# Patient Record
Sex: Male | Born: 1945 | Race: White | Hispanic: No | Marital: Married | Smoking: Never smoker
Health system: Southern US, Community
[De-identification: ages and names within clinical notes are randomized; demographics above are authoritative.]

## PROBLEM LIST (undated history)

## (undated) DIAGNOSIS — I10 Essential (primary) hypertension: Secondary | ICD-10-CM

## (undated) DIAGNOSIS — E079 Disorder of thyroid, unspecified: Secondary | ICD-10-CM

## (undated) DIAGNOSIS — Z860100 Personal history of colon polyps, unspecified: Secondary | ICD-10-CM

## (undated) DIAGNOSIS — D126 Benign neoplasm of colon, unspecified: Secondary | ICD-10-CM

## (undated) DIAGNOSIS — T7840XA Allergy, unspecified, initial encounter: Secondary | ICD-10-CM

## (undated) DIAGNOSIS — E7800 Pure hypercholesterolemia, unspecified: Secondary | ICD-10-CM

## (undated) HISTORY — DX: Personal history of colon polyps, unspecified: Z86.0100

## (undated) HISTORY — PX: FINGER SURGERY: SHX640

## (undated) HISTORY — DX: Disorder of thyroid, unspecified: E07.9

## (undated) HISTORY — DX: Essential (primary) hypertension: I10

## (undated) HISTORY — DX: Benign neoplasm of colon, unspecified: D12.6

## (undated) HISTORY — DX: Allergy, unspecified, initial encounter: T78.40XA

## (undated) HISTORY — DX: Pure hypercholesterolemia, unspecified: E78.00

## (undated) HISTORY — DX: Personal history of colonic polyps: Z86.010

## (undated) HISTORY — PX: ELBOW SURGERY: SHX618

---

## 2000-12-12 ENCOUNTER — Encounter (INDEPENDENT_AMBULATORY_CARE_PROVIDER_SITE_OTHER): Payer: Self-pay | Admitting: *Deleted

## 2000-12-12 ENCOUNTER — Ambulatory Visit (HOSPITAL_COMMUNITY): Admission: RE | Admit: 2000-12-12 | Discharge: 2000-12-12 | Payer: Self-pay | Admitting: Gastroenterology

## 2006-02-03 ENCOUNTER — Ambulatory Visit: Payer: Self-pay | Admitting: Family Medicine

## 2006-02-07 ENCOUNTER — Ambulatory Visit: Payer: Self-pay | Admitting: Family Medicine

## 2006-09-29 ENCOUNTER — Ambulatory Visit: Payer: Self-pay | Admitting: Family Medicine

## 2006-12-09 ENCOUNTER — Ambulatory Visit: Payer: Self-pay | Admitting: Family Medicine

## 2006-12-16 ENCOUNTER — Ambulatory Visit: Payer: Self-pay | Admitting: Family Medicine

## 2007-05-08 ENCOUNTER — Ambulatory Visit: Payer: Self-pay | Admitting: Family Medicine

## 2007-09-08 ENCOUNTER — Ambulatory Visit: Payer: Self-pay | Admitting: Family Medicine

## 2008-05-30 ENCOUNTER — Ambulatory Visit: Payer: Self-pay | Admitting: Family Medicine

## 2008-06-10 ENCOUNTER — Ambulatory Visit: Payer: Self-pay | Admitting: Family Medicine

## 2008-06-14 ENCOUNTER — Ambulatory Visit: Payer: Self-pay | Admitting: Family Medicine

## 2008-08-16 ENCOUNTER — Ambulatory Visit: Payer: Self-pay | Admitting: Family Medicine

## 2009-08-03 ENCOUNTER — Ambulatory Visit: Payer: Self-pay | Admitting: Family Medicine

## 2009-09-04 ENCOUNTER — Ambulatory Visit: Payer: Self-pay | Admitting: Family Medicine

## 2010-03-05 ENCOUNTER — Other Ambulatory Visit: Payer: Self-pay | Admitting: Gastroenterology

## 2010-03-05 LAB — HM COLONOSCOPY: HM Colonoscopy: NORMAL

## 2010-06-08 NOTE — Op Note (Signed)
[place]  Patient:    Neil, Rivera Visit Number: [id number] MRN: [id number]          Service Type: END Location: ENDO Attending Physician:  Rich Brave Dictated by:   Florencia Reasons, M.D. Proc. Date: 12/12/00 Admit Date:  12/12/2000 Discharge Date: 12/12/2000   CC:         Ronnald Nian, M.D.   Operative Report  PROCEDURE:  Upper endoscopy.  ENDOSCOPIST:  Florencia Reasons, M.D.  INDICATIONS:  Transiently-positive Hemoccult stool in a 65 year old.  FINDINGS:  Small hiatal hernia with esophageal ring.  Possible chronic esophageal changes from acid reflux.  INFORMED CONSENT:  The nature, purpose and risk of the procedure have been discussed with the patient who provided written consent.  DESCRIPTION OF PROCEDURE:  Sedation was fentanyl 60 mcg and Versed 6 mg IV without arrhythmias or desaturation.  The Fujinon video endoscope was passed under direct vision.  The esophagus was basically normal.  The distal esophagus did have some slight squamous irregularity raising the question of some chronic esophageal inflammatory changes in the past, but there was no evidence of active inflammation at this time nor any evidence of Barretts esophagus, varices, infection or neoplasia.  A widely patent esophageal ring was present and below this was a small 2 cm hiatal hernia.  The stomach contained no significant residual and had normal mucosa without evidence of gastritis, erosions, ulcers, polyps or masses including a retroflexed view of the proximal stomach. The pylorus, duodenal bulb and second duodenum looked normal.  The scope was removed from the patient.  No biopsies were obtained.  He tolerated the procedure well, and there were no apparent complications.  IMPRESSION:   Small hiatal hernia with esophageal ring.  Otherwise normal exam.  PLAN:  Proceed to colonoscopic evaluation since no source of heme-positive stool was  identified on the current examination. Dictated by:   Florencia Reasons, M.D. Attending Physician:  Rich Brave DD:  12/12/00 TD:  12/14/00 Job: [id number]

## 2010-06-08 NOTE — Procedures (Signed)
. [facility]  Patient:    Neil Rivera, Neil Rivera Visit Number: [id number] MRN: [id number]          Service Type: END Location: ENDO Attending Physician:  Rich Brave Dictated by:   Florencia Reasons, M.D. Proc. Date: 12/12/00 Admit Date:  12/12/2000 Discharge Date: 12/12/2000   CC:         Ronnald Nian, M.D.   Procedure Report  PROCEDURE:  Colonoscopy with biopsies.  ENDOSCOPIST:  Florencia Reasons, M.D.  INDICATION:  Heme-positive stool in a 65 year old with a negative upper endoscopy.  FINDINGS:  Three small colon polyps.  Otherwise normal exam.  No source of heme positivity identified.  DESCRIPTION OF PROCEDURE:  The nature, purposes, and risks of the procedure had been discussed with the patient who provided written consent.  Sedation for this procedure and the upper endoscopy which preceded it was fentanyl 100 mcg and Versed 10 mg IV without arrhythmias or desaturation.  Digital exam of the prostate was unremarkable.  The Olympus colonoscope was advanced to the cecum and pullback was the performed.  There was a 3-mm sessile polyp biopsied in the cecum, and there were two small sessile pale polyps in the ascending colon, again essentially removed by cold biopsy technique.  No other polyps were seen.  There was no evidence of large polyps, cancer, colitis, vascular malformations, or diverticular disease anywhere in the colon.  The quality of the prep was very good, and it was felt that all areas were well seen.  The patient tolerated the procedure well, and there were no apparent complications.  IMPRESSION:  Several small colon polyps essentially removed by cold biopsy technique.  Otherwise normal exam.  No source of transient heme positivity identified.  PLAN:  Await pathology on polyps. Dictated by:   Florencia Reasons, M.D. Attending Physician:  Rich Brave DD:  12/12/00 TD:  12/14/00 Job:  [id number]

## 2010-08-31 ENCOUNTER — Other Ambulatory Visit: Payer: Self-pay | Admitting: Family Medicine

## 2010-10-03 ENCOUNTER — Encounter: Payer: Self-pay | Admitting: Family Medicine

## 2010-10-05 ENCOUNTER — Encounter: Payer: Self-pay | Admitting: Family Medicine

## 2010-10-05 ENCOUNTER — Ambulatory Visit (INDEPENDENT_AMBULATORY_CARE_PROVIDER_SITE_OTHER): Payer: BC Managed Care – PPO | Admitting: Family Medicine

## 2010-10-05 VITALS — BP 140/82 | HR 64 | Ht 69.0 in | Wt 175.0 lb

## 2010-10-05 DIAGNOSIS — Z0000 Encounter for general adult medical examination without abnormal findings: Secondary | ICD-10-CM

## 2010-10-05 DIAGNOSIS — E039 Hypothyroidism, unspecified: Secondary | ICD-10-CM | POA: Insufficient documentation

## 2010-10-05 DIAGNOSIS — E785 Hyperlipidemia, unspecified: Secondary | ICD-10-CM

## 2010-10-05 DIAGNOSIS — Z833 Family history of diabetes mellitus: Secondary | ICD-10-CM | POA: Insufficient documentation

## 2010-10-05 DIAGNOSIS — Z23 Encounter for immunization: Secondary | ICD-10-CM

## 2010-10-05 DIAGNOSIS — Z8249 Family history of ischemic heart disease and other diseases of the circulatory system: Secondary | ICD-10-CM

## 2010-10-05 DIAGNOSIS — I10 Essential (primary) hypertension: Secondary | ICD-10-CM

## 2010-10-05 LAB — COMPREHENSIVE METABOLIC PANEL
Albumin: 4.4 g/dL (ref 3.5–5.2)
Alkaline Phosphatase: 81 U/L (ref 39–117)
CO2: 27 mEq/L (ref 19–32)
Calcium: 9.3 mg/dL (ref 8.4–10.5)
Chloride: 106 mEq/L (ref 96–112)
Glucose, Bld: 102 mg/dL — ABNORMAL HIGH (ref 70–99)
Potassium: 3.9 mEq/L (ref 3.5–5.3)
Sodium: 142 mEq/L (ref 135–145)
Total Protein: 6.8 g/dL (ref 6.0–8.3)

## 2010-10-05 LAB — CBC WITH DIFFERENTIAL/PLATELET
Basophils Relative: 1 % (ref 0–1)
Eosinophils Absolute: 0.3 10*3/uL (ref 0.0–0.7)
Lymphs Abs: 1.3 10*3/uL (ref 0.7–4.0)
MCH: 33.5 pg (ref 26.0–34.0)
MCHC: 33.9 g/dL (ref 30.0–36.0)
Neutrophils Relative %: 64 % (ref 43–77)
Platelets: 146 10*3/uL — ABNORMAL LOW (ref 150–400)
RBC: 4.96 MIL/uL (ref 4.22–5.81)

## 2010-10-05 LAB — LIPID PANEL
LDL Cholesterol: 85 mg/dL (ref 0–99)
Triglycerides: 87 mg/dL (ref <150)

## 2010-10-05 NOTE — Progress Notes (Signed)
Pt referred to Dr Melburn Popper [phone]  Oct 18 9:45a they have moved to [employer] [address] [employer]   Pt made aware

## 2010-10-05 NOTE — Progress Notes (Signed)
  Subjective:    Patient ID: Neil Rivera, male    DOB: 03-01-1945, 65 y.o.   MRN: [id number]  HPI He is here for complete examination. He does exercise fairly regularly with walking. Allergies are under good control. He continues on blood pressure medication as well as his Lipitor and thyroid medication. He has no concerns about any of these. His work continues to go well. He was remarried several years ago and this is going well. Work is also going well.  Review of Systems  Constitutional: Negative.   HENT: Negative.   Eyes: Negative.   Respiratory: Negative for choking.   Cardiovascular: Negative.   Gastrointestinal: Negative.   Genitourinary: Negative.   Neurological: Negative.        Objective:   Physical Exam BP 140/82  Pulse 64  Ht 5\' 9"  (1.753 m)  Wt 175 lb (79.379 kg)  BMI 25.84 kg/m2  General Appearance:    Alert, cooperative, no distress, appears stated age  Head:    Normocephalic, without obvious abnormality, atraumatic  Eyes:    PERRL, conjunctiva/corneas clear, EOM's intact, fundi    benign  Ears:    Normal TM's and external ear canals  Nose:   Nares normal, mucosa normal, no drainage or sinus   tenderness  Throat:   Lips, mucosa, and tongue normal; teeth and gums normal  Neck:   Supple, no lymphadenopathy;  thyroid:  no   enlargement/tenderness/nodules; no carotid   bruit or JVD  Back:    Spine nontender, no curvature, ROM normal, no CVA     tenderness  Lungs:     Clear to auscultation bilaterally without wheezes, rales or     ronchi; respirations unlabored  Chest Wall:    No tenderness or deformity   Heart:    Regular rate and rhythm, S1 and S2 normal, no murmur, rub   or gallop  Breast Exam:    No chest wall tenderness, masses or gynecomastia  Abdomen:     Soft, non-tender, nondistended, normoactive bowel sounds,    no masses, no hepatosplenomegaly      Rectal:    Normal sphincter tone, no masses or tenderness; guaiac negative stool.  Prostate smooth, no  nodules, not enlarged.  Extremities:   No clubbing, cyanosis or edema  Pulses:   2+ and symmetric all extremities  Skin:   Skin color, texture, turgor normal, no rashes or lesions  Lymph nodes:   Cervical, supraclavicular, and axillary nodes normal  Neurologic:   CNII-XII intact, normal strength, sensation and gait; reflexes 2+ and symmetric throughout          Psych:   Normal mood, affect, hygiene and grooming.           Assessment & Plan:   1. Hyperlipidemia LDL goal < 100    2. Hypertension    3. Hypothyroid  TSH  4. Family history of heart disease in male family member before age 42  Ambulatory referral to Cardiology, PR ELECTROCARDIOGRAM, COMPLETE, CBC w/Diff, Comprehensive metabolic panel, Lipid panel  5. Family history of diabetes mellitus    6. Routine general medical examination at a health care facility  CBC w/Diff, Comprehensive metabolic panel, Lipid panel, Flu vaccine greater than or equal to 3yo with preservative IM

## 2010-10-08 ENCOUNTER — Telehealth: Payer: Self-pay

## 2010-10-08 NOTE — Telephone Encounter (Signed)
Called pt to inform pt that labs look good

## 2010-10-14 ENCOUNTER — Emergency Department (HOSPITAL_COMMUNITY): Payer: BC Managed Care – PPO

## 2010-10-14 ENCOUNTER — Emergency Department (HOSPITAL_COMMUNITY)
Admission: EM | Admit: 2010-10-14 | Discharge: 2010-10-14 | Disposition: A | Discharge status: Home / Self Care | Payer: BC Managed Care – PPO | Attending: Emergency Medicine | Admitting: Emergency Medicine

## 2010-10-14 DIAGNOSIS — I498 Other specified cardiac arrhythmias: Secondary | ICD-10-CM | POA: Insufficient documentation

## 2010-10-14 DIAGNOSIS — I10 Essential (primary) hypertension: Secondary | ICD-10-CM | POA: Insufficient documentation

## 2010-10-14 DIAGNOSIS — H81399 Other peripheral vertigo, unspecified ear: Secondary | ICD-10-CM | POA: Insufficient documentation

## 2010-10-14 DIAGNOSIS — R1110 Vomiting, unspecified: Secondary | ICD-10-CM | POA: Insufficient documentation

## 2010-10-14 LAB — POCT I-STAT TROPONIN I

## 2010-10-15 ENCOUNTER — Ambulatory Visit (INDEPENDENT_AMBULATORY_CARE_PROVIDER_SITE_OTHER): Payer: BC Managed Care – PPO | Admitting: Family Medicine

## 2010-10-15 ENCOUNTER — Encounter: Payer: Self-pay | Admitting: Family Medicine

## 2010-10-15 VITALS — BP 110/70 | HR 56 | Wt 172.0 lb

## 2010-10-15 DIAGNOSIS — I498 Other specified cardiac arrhythmias: Secondary | ICD-10-CM

## 2010-10-15 DIAGNOSIS — R42 Dizziness and giddiness: Secondary | ICD-10-CM

## 2010-10-15 DIAGNOSIS — R001 Bradycardia, unspecified: Secondary | ICD-10-CM

## 2010-10-15 NOTE — Patient Instructions (Signed)
Stay on your present blood pressure dosing. Check your pulse daily and call me if it stays in the 50 range.

## 2010-10-15 NOTE — Progress Notes (Signed)
  Subjective:    Patient ID: Neil Rivera, male    DOB: 06-12-1945, 65 y.o.   MRN: [id number]  HPI He was seen yesterday in the emergency room and evaluated for dizziness. He said he woke up yesterday dizzy and eventually went to urgent care. Apparently his pulse was low and he sent him to the emergency room. He was evaluated there and sent home. He was told that this was more likely an inner ear infection but to cut back on his blood pressure medication. Today he is having no difficulty with dizziness, weakness, heart rate.   Review of Systems     Objective:   Physical Exam Alert and in no distress. Cardiac exam shows a regular sinus rhythm without murmurs or gallops. Lungs are clear to auscultation.      Assessment & Plan:   1. Vertigo   2. Bradycardia    he is to use the Antivert as needed and keep track of his pulse rate. If it drops down into the 50s, he will let me know. He does have an appointment to see his cardiologist. I also recommend that he not cut back on his blood pressure medicine and potentially discuss this with his cardiologist.

## 2010-11-07 ENCOUNTER — Encounter: Payer: Self-pay | Admitting: Cardiology

## 2010-11-08 ENCOUNTER — Encounter: Payer: Self-pay | Admitting: Cardiovascular Disease

## 2010-11-08 ENCOUNTER — Ambulatory Visit (INDEPENDENT_AMBULATORY_CARE_PROVIDER_SITE_OTHER): Payer: BC Managed Care – PPO | Admitting: Cardiovascular Disease

## 2010-11-08 DIAGNOSIS — I498 Other specified cardiac arrhythmias: Secondary | ICD-10-CM

## 2010-11-08 DIAGNOSIS — R42 Dizziness and giddiness: Secondary | ICD-10-CM

## 2010-11-08 DIAGNOSIS — R001 Bradycardia, unspecified: Secondary | ICD-10-CM

## 2010-11-08 DIAGNOSIS — I10 Essential (primary) hypertension: Secondary | ICD-10-CM

## 2010-11-08 MED ORDER — HYDROCHLOROTHIAZIDE 25 MG PO TABS: 12.5000 mg | ORAL_TABLET | Freq: Every day | ORAL | Status: DC

## 2010-11-08 NOTE — Progress Notes (Signed)
Neil Rivera Date of Birth  18-Jul-1945 [facility] [address]    [address] [place], [place]  [address] [phone]  Fax  [phone]  History of Present Illness:  The 65 year old gentleman with a history of hypercholesterolemia, hypothyroidism and hypertension.  He's had a negative stress test in the past. He was in Dr. Edwin Dada office recently and was referred back over for further evaluation.  Several weeks ago he had some episodes of dizziness. He went to urgent care.  His heart rate was found to be very slow.  He denies any chest pain. His cardiac workup was negative.  He's not had any recurrent spells.  He walks 2 miles every day without any difficulty. He does his yard work without any difficulty.  Current Outpatient Prescriptions on File Prior to Visit  Medication Sig Dispense Refill  . aspirin 81 MG tablet Take 81 mg by mouth daily.        Marland Kitchen atorvastatin (LIPITOR) 20 MG tablet Take 20 mg by mouth daily.        . bisoprolol-hydrochlorothiazide (ZIAC) 5-6.25 MG per tablet Take 1 tablet by mouth daily.        Marland Kitchen levothyroxine (SYNTHROID, LEVOTHROID) 100 MCG tablet TAKE 1 TABLET BY MOUTH ONCE A DAY  30 tablet  6    No Known Allergies  Past Medical History  Diagnosis Date  . Hypercholesterolemia   . Allergy     RHINITIS  . Hypertension   . Thyroid disease     HYPOTHYROID  . History of colonic polyps     Past Surgical History  Procedure Date  . Elbow surgery   . Finger surgery     History  Smoking status  . Never Smoker   Smokeless tobacco  . Not on file    History  Alcohol Use  . Yes    rare    Family History  Problem Relation Age of Onset  . Heart disease Father   . Diabetes Brother   . Diabetes Maternal Uncle     Reviw of Systems:  Reviewed in the HPI.  All other systems are negative.  Physical Exam: BP 120/84  Pulse 60  Ht 5\' 9"  (1.753 m)  Wt 176 lb (79.833 kg)  BMI 25.99 kg/m2 The patient is alert and oriented x 3.  The mood  and affect are normal.   Skin: warm and dry.  Color is normal.    HEENT:   the sclera are nonicteric.  The mucous membranes are moist.  The carotids are 2+ without bruits.  There is no thyromegaly.  There is no JVD.    Lungs: clear.  The chest wall is non tender.    Heart: regular rate with a normal S1 and S2.  There are no murmurs, gallops, or rubs. The PMI is not displaced.     Abdomen: good bowel sounds.  There is no guarding or rebound.  There is no hepatosplenomegaly or tenderness.  There are no masses.   Extremities:  no clubbing, cyanosis, or edema.  The legs are without rashes.  The distal pulses are intact.   Neuro:  Cranial nerves II - XII are intact.  Motor and sensory functions are intact.    The gait is normal.  ECG:  Assessment / Plan:

## 2010-11-08 NOTE — Assessment & Plan Note (Signed)
His heart rate is fairly slow. He seems to be asymptomatic but he did have an episode of dizziness. We will discontinue the Ziac and started on hydrochlorothiazide for his blood pressure control. We will also get a stress Myoview study for further evaluation. He has a very strong family history of cardiac disease.

## 2010-11-08 NOTE — Patient Instructions (Addendum)
Your physician wants you to follow-up in: 1 month, You will receive a reminder letter in the mail two months in advance. If you don't receive a letter, please call our office to schedule the follow-up appointment.  Your physician recommends that you return for lab work in: 1 month/ bmp  Your physician has recommended you make the following change in your medication:   1) stop Ziac  2) start HCTZ 25 mg one half tablet each morning daily.   Your physician has requested that you have en exercise stress myoview. For further information please visit [email or web address] Please follow instruction sheet, as given.  Your Dr wants you to increase potassium rich foods/ list provided.

## 2010-11-08 NOTE — Assessment & Plan Note (Signed)
We will discontinue the Ziac and started on Hycotuss eye. We'll have him increase his potassium intake. I'll see him in one month for an office visit and basic metabolic profile.

## 2010-11-15 ENCOUNTER — Encounter: Payer: Self-pay | Admitting: *Deleted

## 2010-11-20 ENCOUNTER — Ambulatory Visit (HOSPITAL_COMMUNITY): Payer: BC Managed Care – PPO | Attending: Cardiovascular Disease | Admitting: Radiology

## 2010-11-20 DIAGNOSIS — R42 Dizziness and giddiness: Secondary | ICD-10-CM

## 2010-11-20 DIAGNOSIS — I4949 Other premature depolarization: Secondary | ICD-10-CM

## 2010-11-20 DIAGNOSIS — R001 Bradycardia, unspecified: Secondary | ICD-10-CM

## 2010-11-20 DIAGNOSIS — R0989 Other specified symptoms and signs involving the circulatory and respiratory systems: Secondary | ICD-10-CM

## 2010-11-20 DIAGNOSIS — I498 Other specified cardiac arrhythmias: Secondary | ICD-10-CM | POA: Insufficient documentation

## 2010-11-20 DIAGNOSIS — I10 Essential (primary) hypertension: Secondary | ICD-10-CM | POA: Insufficient documentation

## 2010-11-20 MED ORDER — TECHNETIUM TC 99M TETROFOSMIN IV KIT
11.0000 | PACK | Freq: Once | INTRAVENOUS | Status: AC | PRN
  Administered 2010-11-20: 11 via INTRAVENOUS

## 2010-11-20 MED ORDER — TECHNETIUM TC 99M TETROFOSMIN IV KIT
33.0000 | PACK | Freq: Once | INTRAVENOUS | Status: AC | PRN
  Administered 2010-11-20: 33 via INTRAVENOUS

## 2010-11-20 NOTE — Progress Notes (Signed)
[facility] SITE 3 NUCLEAR MED [address] [place] [address] [phone]  Cardiology Nuclear Med Study  Neil Rivera is a 65 y.o. male [id number] 04-Jul-1945   Nuclear Med Background Indication for Stress Test:  Evaluation for Ischemia and 9/12: [facility]) Dizziness, Bradycardia, (-) enzymes History:  No previous documented CAD and Last myoview ? '02 Myocardial Perfusion Study [facility]): (-) ischemia per recent dictation (Medical records unable to locate chart) Cardiac Risk Factors: Strong Family History - CAD, Hypertension and Lipids  Symptoms:  Dizziness, DOE, Nausea and Vomiting   Nuclear Pre-Procedure Caffeine/Decaff Intake:  None NPO After: 8:00pm   Lungs:  Clear IV 0.9% NS with Angio Cath:  22g  IV Site: R Antecubital x 1, tolerated well IV Started by:  Irean Hong, RN  Chest Size (in):  38 Cup Size: n/a  Height: 5\' 9"  (1.753 m)  Weight:  173 lb (78.472 kg)  BMI:  Body mass index is 25.55 kg/(m^2). Tech Comments:  N/A    Nuclear Med Study 1 or 2 day study: 1 day  Stress Test Type:  Stress  Reading MD: Olga Millers, MD  Order Authorizing Provider:  Kristeen Miss, MD  Resting Radionuclide: Technetium 8m Tetrofosmin  Resting Radionuclide Dose: 11.0 mCi   Stress Radionuclide:  Technetium 57m Tetrofosmin  Stress Radionuclide Dose: 33.0 mCi           Stress Protocol Rest HR: 53 Stress HR: 151  Rest BP: 128/81 Stress BP: 198/97  Exercise Time (min): 9:00 METS: 10.1   Predicted Max HR: 156 bpm % Max HR: 96.79 bpm Rate Pressure Product: 69629   Dose of Adenosine (mg):  n/a Dose of Lexiscan: n/a mg  Dose of Atropine (mg): n/a Dose of Dobutamine: n/a mcg/kg/min (at max HR)  Stress Test Technologist: Irean Hong, RN  Nuclear Technologist:  Domenic Polite, CNMT     Rest Procedure:  Myocardial perfusion imaging was performed at rest 45 minutes following the intravenous administration of Technetium 50m Tetrofosmin. Rest ECG: Sinus  Bradycardia, Poor R wave progression  Stress Procedure:  The patient exercised for 9 minutes, RPE=15.  The patient stopped due to DOE and Fatigue and denied any chest pain.  There were nonspecific ST-T wave changes. There was frequent Trigeminy Fusion beats, and occasional fusion beats in recovery, but the Patient was asymptomatic.  Technetium 94m Tetrofosmin was injected at peak exercise and myocardial perfusion imaging was performed after a brief delay. Stress ECG: No significant ST segment change suggestive of ischemia.  QPS Raw Data Images:  Acquisition technically good; normal left ventricular size. Stress Images:  Normal homogeneous uptake in all areas of the myocardium. Rest Images:  Normal homogeneous uptake in all areas of the myocardium. Subtraction (SDS):  No evidence of ischemia. Transient Ischemic Dilatation (Normal <1.22):  0.93 Lung/Heart Ratio (Normal <0.45):  0.33  Quantitative Gated Spect Images QGS EDV:  84 ml QGS ESV:  34 ml QGS cine images:  NL LV Function; NL Wall Motion QGS EF: 60%  Impression Exercise Capacity:  Good exercise capacity. BP Response:  Normal blood pressure response. Clinical Symptoms:  No chest pain. ECG Impression:  No significant ST segment change suggestive of ischemia. Comparison with Prior Nuclear Study: No images to compare  Overall Impression:  Normal stress nuclear study with no ischemia or infarction.    Olga Millers

## 2010-11-22 ENCOUNTER — Telehealth: Payer: Self-pay | Admitting: *Deleted

## 2010-11-22 NOTE — Telephone Encounter (Signed)
msg left normal stress test to call office with further questions or concerns.

## 2010-11-22 NOTE — Telephone Encounter (Signed)
Message copied by Antony Odea on Thu Nov 22, 2010  4:16 PM ------      Message from: Vesta Mixer      Created: Wed Nov 21, 2010  1:14 PM       Normal stress myoview

## 2010-12-10 ENCOUNTER — Encounter: Payer: Self-pay | Admitting: Cardiovascular Disease

## 2010-12-10 ENCOUNTER — Other Ambulatory Visit: Payer: BC Managed Care – PPO | Admitting: *Deleted

## 2010-12-10 ENCOUNTER — Ambulatory Visit (INDEPENDENT_AMBULATORY_CARE_PROVIDER_SITE_OTHER): Payer: BC Managed Care – PPO | Admitting: Cardiovascular Disease

## 2010-12-10 DIAGNOSIS — R001 Bradycardia, unspecified: Secondary | ICD-10-CM

## 2010-12-10 DIAGNOSIS — I10 Essential (primary) hypertension: Secondary | ICD-10-CM

## 2010-12-10 DIAGNOSIS — I498 Other specified cardiac arrhythmias: Secondary | ICD-10-CM

## 2010-12-10 LAB — BASIC METABOLIC PANEL
CO2: 27 mEq/L (ref 19–32)
Calcium: 9 mg/dL (ref 8.4–10.5)
Chloride: 103 mEq/L (ref 96–112)
Sodium: 140 mEq/L (ref 135–145)

## 2010-12-10 NOTE — Patient Instructions (Signed)
Your physician recommends that you schedule a follow-up appointment in: AS NEEDED BASIS  Your physician recommends that you have lab work today; BMET, we will call you with results with in a week or sooner.

## 2010-12-10 NOTE — Assessment & Plan Note (Signed)
His blood pressure is well-controlled. He is on HCTZ. We will check a basic metabolic profile today. I'll will be happy to see him in the future if needed.

## 2010-12-10 NOTE — Assessment & Plan Note (Addendum)
His episodes of bradycardia and dizziness have resolved after we stopped the bisoprolol. He'll continue with his other medications. I will see him on an as-needed basis.

## 2010-12-10 NOTE — Progress Notes (Signed)
  Neil Rivera Date of Birth  1945/05/16 [facility] [address]    [address] [place], [place]  [address] [phone]  Fax  [phone]  History of Present Illness:  The 65 year old gentleman with a history of hypercholesterolemia, hypothyroidism and hypertension.  He's had a negative stress test in the past. He was in Dr. Jola Babinski  office several months and was referred back over for further evaluation of some episodes of dizziness and bradycardia.  He had a stress Myoview study which was negative for ischemia. He has normal left ventricular systolic function with an ejection fraction of 60%.  He walks 2 miles every day without any difficulty. He does his yard work without any difficulty.  Current Outpatient Prescriptions on File Prior to Visit  Medication Sig Dispense Refill  . aspirin 81 MG tablet Take 81 mg by mouth daily.        Marland Kitchen atorvastatin (LIPITOR) 20 MG tablet Take 20 mg by mouth daily.        . hydrochlorothiazide (HYDRODIURIL) 25 MG tablet Take 0.5 tablets (12.5 mg total) by mouth daily.  30 tablet  5  . levothyroxine (SYNTHROID, LEVOTHROID) 100 MCG tablet TAKE 1 TABLET BY MOUTH ONCE A DAY  30 tablet  6    No Known Allergies  Past Medical History  Diagnosis Date  . Hypercholesterolemia   . Allergy     RHINITIS  . Hypertension   . Thyroid disease     HYPOTHYROID  . History of colonic polyps     Past Surgical History  Procedure Date  . Elbow surgery   . Finger surgery     History  Smoking status  . Never Smoker   Smokeless tobacco  . Not on file    History  Alcohol Use  . Yes    rare    Family History  Problem Relation Age of Onset  . Heart disease Father   . Diabetes Brother   . Diabetes Maternal Uncle     Reviw of Systems:  Reviewed in the HPI.  All other systems are negative.  Physical Exam: BP 122/80  Pulse 75  Ht 5\' 9"  (1.753 m)  Wt 174 lb 1.9 oz (78.98 kg)  BMI 25.71 kg/m2 The patient is alert and oriented x 3.  The  mood and affect are normal.   Skin: warm and dry.  Color is normal.    HEENT:   the sclera are nonicteric.  The mucous membranes are moist.  The carotids are 2+ without bruits.  There is no thyromegaly.  There is no JVD.    Lungs: clear.  The chest wall is non tender.    Heart: regular rate with a normal S1 and S2.  There are no murmurs, gallops, or rubs. The PMI is not displaced.     Abdomen: good bowel sounds.  There is no guarding or rebound.  There is no hepatosplenomegaly or tenderness.  There are no masses.   Extremities:  no clubbing, cyanosis, or edema.  The legs are without rashes.  The distal pulses are intact.   Neuro:  Cranial nerves II - XII are intact.  Motor and sensory functions are intact.    The gait is normal.  Assessment / Plan:

## 2010-12-11 ENCOUNTER — Telehealth: Payer: Self-pay | Admitting: *Deleted

## 2010-12-11 ENCOUNTER — Other Ambulatory Visit: Payer: Self-pay | Admitting: Cardiovascular Disease

## 2010-12-11 MED ORDER — POTASSIUM CHLORIDE ER 10 MEQ PO TBCR: 10.0000 meq | EXTENDED_RELEASE_TABLET | Freq: Two times a day (BID) | ORAL | Status: DC

## 2010-12-11 NOTE — Telephone Encounter (Signed)
Message copied by Antony Odea on Tue Dec 11, 2010  1:58 PM ------      Message from: [place], Deloris Ping      Created: Tue Dec 11, 2010  1:06 PM       Potassium is low.  Add KCl 10 meq QD.  Will need to recheck  In several weeks BMP

## 2010-12-11 NOTE — Telephone Encounter (Signed)
Ok to wait and not start KCl yet.  Will need BMP checked in 1 month at lower dose of HCTZ

## 2010-12-11 NOTE — Telephone Encounter (Signed)
Spoke with pt about hypokalemia, Dr Elease Hashimoto wanted to start K+ 10 meq and re check labs in 3 weeks,  pt states he just found out he has been taking double the amount of HCTZ. Rx is 12.5 mg and he has been taking 25 mg. He has also had his bisoprolol stopped prior due to hypotension/ last office BP was 122/80 . Dr Elease Hashimoto is here tomorrow and pt was told to weight to pick up k+ till Dr Elease Hashimoto has a chance to give advise.

## 2010-12-12 NOTE — Telephone Encounter (Signed)
Called msg to work number to hold off on k+ and have him continue with lower dose of hctz 12.5 mg. Lab re check is set and informed him of date. Pt to call back with questions or concerns.

## 2010-12-26 ENCOUNTER — Other Ambulatory Visit: Payer: Self-pay | Admitting: Family Medicine

## 2011-01-03 ENCOUNTER — Telehealth: Payer: Self-pay | Admitting: *Deleted

## 2011-01-03 ENCOUNTER — Ambulatory Visit (INDEPENDENT_AMBULATORY_CARE_PROVIDER_SITE_OTHER): Payer: BC Managed Care – PPO | Admitting: *Deleted

## 2011-01-03 DIAGNOSIS — I10 Essential (primary) hypertension: Secondary | ICD-10-CM

## 2011-01-03 LAB — BASIC METABOLIC PANEL
BUN: 21 mg/dL (ref 6–23)
CO2: 27 mEq/L (ref 19–32)
Calcium: 8.9 mg/dL (ref 8.4–10.5)
Chloride: 107 mEq/L (ref 96–112)
Creatinine, Ser: 1 mg/dL (ref 0.4–1.5)

## 2011-01-03 MED ORDER — POTASSIUM CHLORIDE ER 10 MEQ PO TBCR: 10.0000 meq | EXTENDED_RELEASE_TABLET | Freq: Every day | ORAL | Status: DC

## 2011-01-03 NOTE — Telephone Encounter (Signed)
Spoke with pt and he was not taking k+ he was told to wait to start it till lab draw and to just eat potassium rich foods. I changed script to 10 meq daily/ his hctz is 12.5 mg, redraw set.

## 2011-01-03 NOTE — Telephone Encounter (Signed)
Message copied by Antony Odea on Thu Jan 03, 2011  6:16 PM ------      Message from: Vesta Mixer      Created: Thu Jan 03, 2011  3:57 PM       Double KCl.  Recheck in 1 month

## 2011-01-31 ENCOUNTER — Ambulatory Visit (INDEPENDENT_AMBULATORY_CARE_PROVIDER_SITE_OTHER): Payer: BC Managed Care – PPO | Admitting: *Deleted

## 2011-01-31 DIAGNOSIS — I10 Essential (primary) hypertension: Secondary | ICD-10-CM

## 2011-01-31 LAB — BASIC METABOLIC PANEL
BUN: 23 mg/dL (ref 6–23)
Calcium: 8.7 mg/dL (ref 8.4–10.5)
Creatinine, Ser: 0.9 mg/dL (ref 0.4–1.5)
GFR: 91.27 mL/min (ref >60.00)
Glucose, Bld: 93 mg/dL (ref 70–99)

## 2011-04-11 ENCOUNTER — Ambulatory Visit (INDEPENDENT_AMBULATORY_CARE_PROVIDER_SITE_OTHER): Payer: BC Managed Care – PPO | Admitting: Family Medicine

## 2011-04-11 ENCOUNTER — Encounter: Payer: Self-pay | Admitting: Family Medicine

## 2011-04-11 VITALS — BP 142/82 | HR 103 | Wt 173.0 lb

## 2011-04-11 DIAGNOSIS — F438 Other reactions to severe stress: Secondary | ICD-10-CM

## 2011-04-11 DIAGNOSIS — Z638 Other specified problems related to primary support group: Secondary | ICD-10-CM

## 2011-04-11 DIAGNOSIS — Z6379 Other stressful life events affecting family and household: Secondary | ICD-10-CM

## 2011-04-11 NOTE — Progress Notes (Signed)
  Subjective:    Patient ID: Neil Rivera, male    DOB: 13-Apr-1945, 66 y.o.   MRN: [id number]  HPI He is here for consultation. He has had difficulty dealing with family stress issues. He revolves around his son, daughter, stepdaughter. Presently he is involved in counseling. He has had some difficulty with chest discomfort however it is usually associated with stress of dealing with the above issues. Several months ago he did have a stress test which was negative. He walks 1-2 miles everyday.   Review of Systems     Objective:   Physical Exam Alert and in no distress otherwise not examined       Assessment & Plan:   1. Stressful life event affecting family    45 minutes spent discussing these stressful situations. Encouraged him to continue in counseling. Also recommended heat and deep to go to his son's wedding in [place]. Also recommend he discuss stressful situations with his son and daughter. They gave him several pointers on the best way to handle and acknowledge his own deficiencies.

## 2011-04-14 ENCOUNTER — Other Ambulatory Visit: Payer: Self-pay | Admitting: Cardiovascular Disease

## 2011-04-26 ENCOUNTER — Other Ambulatory Visit: Payer: Self-pay | Admitting: Family Medicine

## 2011-06-12 ENCOUNTER — Other Ambulatory Visit: Payer: Self-pay | Admitting: Family Medicine

## 2011-06-13 DIAGNOSIS — F4321 Adjustment disorder with depressed mood: Secondary | ICD-10-CM | POA: Diagnosis not present

## 2011-06-20 DIAGNOSIS — F4321 Adjustment disorder with depressed mood: Secondary | ICD-10-CM | POA: Diagnosis not present

## 2011-06-23 ENCOUNTER — Other Ambulatory Visit: Payer: Self-pay | Admitting: Family Medicine

## 2011-06-26 ENCOUNTER — Encounter: Payer: Self-pay | Admitting: Internal Medicine

## 2011-06-27 DIAGNOSIS — F4321 Adjustment disorder with depressed mood: Secondary | ICD-10-CM | POA: Diagnosis not present

## 2011-07-05 ENCOUNTER — Encounter: Payer: Self-pay | Admitting: Family Medicine

## 2011-07-05 ENCOUNTER — Ambulatory Visit (INDEPENDENT_AMBULATORY_CARE_PROVIDER_SITE_OTHER): Payer: Medicare Other | Admitting: Family Medicine

## 2011-07-05 VITALS — BP 110/80 | HR 64 | Ht 69.0 in | Wt 170.0 lb

## 2011-07-05 DIAGNOSIS — E039 Hypothyroidism, unspecified: Secondary | ICD-10-CM | POA: Diagnosis not present

## 2011-07-05 DIAGNOSIS — Z0000 Encounter for general adult medical examination without abnormal findings: Secondary | ICD-10-CM

## 2011-07-05 DIAGNOSIS — Z8249 Family history of ischemic heart disease and other diseases of the circulatory system: Secondary | ICD-10-CM | POA: Diagnosis not present

## 2011-07-05 DIAGNOSIS — E785 Hyperlipidemia, unspecified: Secondary | ICD-10-CM | POA: Diagnosis not present

## 2011-07-05 LAB — TSH: TSH: 1.101 u[IU]/mL (ref 0.350–4.500)

## 2011-07-05 LAB — LIPID PANEL: Cholesterol: 147 mg/dL (ref 0–200)

## 2011-07-05 NOTE — Progress Notes (Signed)
  Subjective:    Patient ID: Neil Rivera, male    DOB: 1945-06-19, 66 y.o.   MRN: [id number]  HPI He is here for his initial "Welcome to Medicare" evaluation. His medical and social history were reviewed. He does continue in counseling. This is going quite well. He keeps himself quite active physically. He has had no falls. End-of-life issues were discussed with him. He does have a will but does not as yet have an advanced directly. This information will be sent to him. He continues on medications listed in the chart. Review his record indicates he does need followup on thyroid as well as lipids.   Review of Systems     Objective:   Physical Exam Her and in no distress otherwise not examined      Assessment & Plan:   1. Hypothyroid  TSH  2. Family history of heart disease in male family member before age 60  Lipid panel  3. Hyperlipidemia LDL goal < 100  Lipid panel   I discussed end-of-life issues with him. Encouraged him to continue with his active lifestyle and remain in counseling to help with some of the issues he is dealing with. He is to this point not a fall risk. He does have a trip planned to [place] to see his son get married. We discussed this and the need for him to take a very positive attitude towards this.

## 2011-07-08 ENCOUNTER — Other Ambulatory Visit: Payer: Self-pay

## 2011-07-08 MED ORDER — LEVOTHYROXINE SODIUM 100 MCG PO TABS: 100.0000 ug | ORAL_TABLET | Freq: Every day | ORAL | Status: DC

## 2011-07-08 MED ORDER — ATORVASTATIN CALCIUM 20 MG PO TABS: 20.0000 mg | ORAL_TABLET | Freq: Every day | ORAL | Status: DC

## 2011-07-08 NOTE — Telephone Encounter (Signed)
Sent med in for 1 year per [employer]

## 2011-07-09 ENCOUNTER — Telehealth: Payer: Self-pay | Admitting: Internal Medicine

## 2011-07-09 MED ORDER — ALPRAZOLAM 0.25 MG PO TABS: ORAL_TABLET | ORAL | Status: DC

## 2011-07-09 NOTE — Telephone Encounter (Signed)
Call in Xanax 0.25 mg. # 12. 1 as needed for anxiety

## 2011-07-09 NOTE — Telephone Encounter (Signed)
Called in xanax 0.25mg  #12 no refills into [facility]

## 2011-07-29 DIAGNOSIS — F4321 Adjustment disorder with depressed mood: Secondary | ICD-10-CM | POA: Diagnosis not present

## 2011-08-07 DIAGNOSIS — F4321 Adjustment disorder with depressed mood: Secondary | ICD-10-CM | POA: Diagnosis not present

## 2011-08-21 DIAGNOSIS — F4321 Adjustment disorder with depressed mood: Secondary | ICD-10-CM | POA: Diagnosis not present

## 2011-08-29 DIAGNOSIS — F4321 Adjustment disorder with depressed mood: Secondary | ICD-10-CM | POA: Diagnosis not present

## 2011-09-25 DIAGNOSIS — F4321 Adjustment disorder with depressed mood: Secondary | ICD-10-CM | POA: Diagnosis not present

## 2011-10-09 DIAGNOSIS — F4321 Adjustment disorder with depressed mood: Secondary | ICD-10-CM | POA: Diagnosis not present

## 2011-10-16 DIAGNOSIS — F4321 Adjustment disorder with depressed mood: Secondary | ICD-10-CM | POA: Diagnosis not present

## 2011-10-24 DIAGNOSIS — F4321 Adjustment disorder with depressed mood: Secondary | ICD-10-CM | POA: Diagnosis not present

## 2011-10-30 DIAGNOSIS — F4321 Adjustment disorder with depressed mood: Secondary | ICD-10-CM | POA: Diagnosis not present

## 2011-11-08 ENCOUNTER — Encounter: Payer: Self-pay | Admitting: Family Medicine

## 2011-11-08 ENCOUNTER — Ambulatory Visit (INDEPENDENT_AMBULATORY_CARE_PROVIDER_SITE_OTHER): Payer: Medicare Other | Admitting: Family Medicine

## 2011-11-08 VITALS — BP 130/90 | HR 66 | Ht 69.0 in | Wt 172.0 lb

## 2011-11-08 DIAGNOSIS — Z8249 Family history of ischemic heart disease and other diseases of the circulatory system: Secondary | ICD-10-CM

## 2011-11-08 DIAGNOSIS — E039 Hypothyroidism, unspecified: Secondary | ICD-10-CM

## 2011-11-08 DIAGNOSIS — Z125 Encounter for screening for malignant neoplasm of prostate: Secondary | ICD-10-CM

## 2011-11-08 DIAGNOSIS — I10 Essential (primary) hypertension: Secondary | ICD-10-CM

## 2011-11-08 DIAGNOSIS — Z23 Encounter for immunization: Secondary | ICD-10-CM

## 2011-11-08 LAB — POCT URINALYSIS DIPSTICK
Blood, UA: NEGATIVE
Ketones, UA: NEGATIVE
Protein, UA: NEGATIVE
Spec Grav, UA: 1.015
Urobilinogen, UA: NEGATIVE
pH, UA: 7

## 2011-11-08 MED ORDER — INFLUENZA VIRUS VACC SPLIT PF IM SUSP: 0.5000 mL | Freq: Once | INTRAMUSCULAR | Status: DC

## 2011-11-08 NOTE — Progress Notes (Signed)
  Subjective:    Patient ID: Neil Rivera, male    DOB: 1945-10-28, 66 y.o.   MRN: [id number]  HPI He is here for an interval evaluation. He continues on medications listed in the chart. He is having no difficulty with his blood pressure meds or thyroid medication. He would like a PSA done. His allergies are under good control. He does exercise regularly. Smoking and drinking and family history was reviewed. In general he is doing quite well.   Review of Systems Negative except as above    Objective:   Physical Exam BP 130/90  Pulse 66  Ht 5\' 9"  (1.753 m)  Wt 172 lb (78.019 kg)  BMI 25.40 kg/m2  SpO2 99%  General Appearance:    Alert, cooperative, no distress, appears stated age  Head:    Normocephalic, without obvious abnormality, atraumatic  Eyes:    PERRL, conjunctiva/corneas clear, EOM's intact, fundi    benign  Ears:    Normal TM's and external ear canals  Nose:   Nares normal, mucosa normal, no drainage or sinus   tenderness  Throat:   Lips, mucosa, and tongue normal; teeth and gums normal  Neck:   Supple, no lymphadenopathy;  thyroid:  no   enlargement/tenderness/nodules; no carotid   bruit or JVD  Back:    Spine nontender, no curvature, ROM normal, no CVA     tenderness  Lungs:     Clear to auscultation bilaterally without wheezes, rales or     ronchi; respirations unlabored  Chest Wall:    No tenderness or deformity   Heart:    Regular rate and rhythm, S1 and S2 normal, no murmur, rub   or gallop  Breast Exam:    No chest wall tenderness, masses or gynecomastia  Abdomen:     Soft, non-tender, nondistended, normoactive bowel sounds,    no masses, no hepatosplenomegaly  Genitalia:   deferred   Rectal:   deferred   Extremities:   No clubbing, cyanosis or edema  Pulses:   2+ and symmetric all extremities  Skin:   Skin color, texture, turgor normal, no rashes or lesions  Lymph nodes:   Cervical, supraclavicular, and axillary nodes normal  Neurologic:   CNII-XII intact,  normal strength, sensation and gait; reflexes 2+ and symmetric throughout          Psych:   Normal mood, affect, hygiene and grooming.          Assessment & Plan:   1. Hypertension  POCT Urinalysis Dipstick  2. Special screening for malignant neoplasm of prostate  PSA, Medicare  3. Need for prophylactic vaccination and inoculation against influenza  influenza  inactive virus vaccine (FLUZONE/FLUARIX) injection 0.5 mL  4. Hypothyroid    5. Family history of heart disease in male family member before age 54     flu shot given with risks and benefits discussed. We also discussed PSA testing with him. I also discussed advanced directive. He will check on his present status.

## 2011-11-08 NOTE — Patient Instructions (Signed)
You can get something at the drugstore called Lo-salt or no salt but probably no more than a teaspoon a day

## 2011-11-09 LAB — PSA, MEDICARE: PSA: 0.91 ng/mL (ref <=4.00)

## 2011-11-10 NOTE — Progress Notes (Signed)
Quick Note:  The blood work is normal ______ 

## 2011-11-11 NOTE — Progress Notes (Signed)
Quick Note:  Called pt home # left message labs are normal ______

## 2011-11-26 ENCOUNTER — Ambulatory Visit (INDEPENDENT_AMBULATORY_CARE_PROVIDER_SITE_OTHER): Payer: Medicare Other | Admitting: Medical

## 2011-11-26 ENCOUNTER — Encounter: Payer: Self-pay | Admitting: Medical

## 2011-11-26 VITALS — BP 130/88 | HR 64 | Temp 98.0°F | Resp 16 | Wt 173.0 lb

## 2011-11-26 DIAGNOSIS — L988 Other specified disorders of the skin and subcutaneous tissue: Secondary | ICD-10-CM

## 2011-11-26 DIAGNOSIS — IMO0002 Reserved for concepts with insufficient information to code with codable children: Secondary | ICD-10-CM

## 2011-11-26 NOTE — Progress Notes (Signed)
Subjective: Here for c/o lump on right index finger.  Not sure how long its been there.  Its sort of aggravating but not painful.  Doesn't limit him in any way.  He is  IT trainer.  No other c/o.   Past Medical History  Diagnosis Date  . Hypercholesterolemia   . Allergy     RHINITIS  . Hypertension   . Thyroid disease     HYPOTHYROID  . History of colonic polyps    ROS  Gen: no fever Neuro: no numbness, tingling, weakness MSK: no joint swelling, pains, myalgias  Objective: Gen: wd, wn, nad Skin: unremarkable, no erythema, ecchymosis, no warmth MSK: left dorsal proximal phalanx with 5mm oval lump that is mobile and cystic appearing, nontender Neurovascularly intact  Assessment: Encounter Diagnosis  Name Primary?  . Cyst of finger Yes   Plan: Discussed diagnosis of small cyst not involving the tendon, reassured.  Advised to leave it alone unless it gets mechanically bothersome.

## 2011-12-05 DIAGNOSIS — F4321 Adjustment disorder with depressed mood: Secondary | ICD-10-CM | POA: Diagnosis not present

## 2012-02-24 DIAGNOSIS — F4321 Adjustment disorder with depressed mood: Secondary | ICD-10-CM | POA: Diagnosis not present

## 2012-03-09 ENCOUNTER — Encounter: Payer: Self-pay | Admitting: Medical

## 2012-03-09 ENCOUNTER — Ambulatory Visit (INDEPENDENT_AMBULATORY_CARE_PROVIDER_SITE_OTHER): Payer: Medicare Other | Admitting: Medical

## 2012-03-09 VITALS — BP 140/90 | HR 58 | Temp 98.2°F | Resp 16 | Wt 175.0 lb

## 2012-03-09 DIAGNOSIS — E039 Hypothyroidism, unspecified: Secondary | ICD-10-CM | POA: Diagnosis not present

## 2012-03-09 DIAGNOSIS — H6120 Impacted cerumen, unspecified ear: Secondary | ICD-10-CM

## 2012-03-09 DIAGNOSIS — H9190 Unspecified hearing loss, unspecified ear: Secondary | ICD-10-CM

## 2012-03-09 DIAGNOSIS — I10 Essential (primary) hypertension: Secondary | ICD-10-CM | POA: Diagnosis not present

## 2012-03-09 DIAGNOSIS — H6123 Impacted cerumen, bilateral: Secondary | ICD-10-CM

## 2012-03-09 MED ORDER — ATORVASTATIN CALCIUM 20 MG PO TABS: 20.0000 mg | ORAL_TABLET | Freq: Every day | ORAL | Status: DC

## 2012-03-09 MED ORDER — LEVOTHYROXINE SODIUM 100 MCG PO TABS: 100.0000 ug | ORAL_TABLET | Freq: Every day | ORAL | Status: DC

## 2012-03-09 MED ORDER — HYDROCHLOROTHIAZIDE 25 MG PO TABS: 12.5000 mg | ORAL_TABLET | Freq: Every day | ORAL | Status: DC

## 2012-03-09 NOTE — Progress Notes (Signed)
Subjective: Here for c/o left ear rumbling x 2 weeks.  Denies vertigo, no dizziness, no fever, no cough, runny nose, sneezing.  No hearing loss.  He uses left ear for telephone without c/o. No other URI symptoms.    He wanted to make sure which medications he is supposed to be taking.  He takes 2 medications daily and OTC Aspirin daily.  Not sure which 2 though.  Thinks he was told to stop one of his medications last visit.  Past Medical History  Diagnosis Date  . Hypercholesterolemia   . Allergy     RHINITIS  . Hypertension   . Thyroid disease     HYPOTHYROID  . History of colonic polyps    ROS as in subjective  Objective: Filed Vitals:   03/09/12 1457  BP: 140/90  Pulse: 58  Temp: 98.2 F (36.8 C)  Resp: 16    General appearance: alert, no distress, WD/WN HEENT: normocephalic, sclerae anicteric, bilat ear canals with impacted cerumen, nares patent, no discharge or erythema, pharynx normal Oral cavity: MMM, no lesions Neck: supple, no lymphadenopathy, no thyromegaly, no masses Heart: RRR, normal S1, S2, no murmurs Lungs: CTA bilaterally, no wheezes, rhonchi, or rales Neuro: CN2-12 intact, nonfocal exam  Assessment: Encounter Diagnoses  Name Primary?  . Hearing decreased Yes  . Impacted cerumen of both ears   . Essential hypertension, benign   . Unspecified hypothyroidism     Plan: Hearing decreased - improved after cerumen removed.   Hearing screen abnormal prior to cerumen removal, but normal afterwards.  Return prn.  Impacted cerumen - discussed risks/benefits of procedure, used warm water lavage to successfully removed cerumen.  The muffled sound in his ear and hearing seemed improved.  Repeat hearing screen normal afterwards.  HTN, hypothyroidism - he was confused about his medications.  Reiterated which medications for each diagnosis, typed list of his medications for reconciliation.  Refilled medication.  Recheck in 1-2 mo, fasting labs.

## 2012-03-09 NOTE — Patient Instructions (Addendum)
Synthroid is for thyroid, and this is taken 1 tablet 1 hour prior to breakfast daily  Hydrochlorothiazide is taken 1/2 tablet of the 25mg  dose once daily at  Breakfast.  Lipitor is for cholesterol.  This is taken at bedtime with 81mg  Aspirin for cholesterol and heart health.

## 2012-03-11 ENCOUNTER — Telehealth: Payer: Self-pay | Admitting: Medical

## 2012-03-11 NOTE — Telephone Encounter (Signed)
Synthroid is for thyroid, and this is taken 1 tablet 1 hour prior to breakfast daily  Hydrochlorothiazide is taken 1/2 tablet of the 25mg dose once daily at  Breakfast.  Lipitor is for cholesterol.  This is taken at bedtime with 81mg Aspirin for cholesterol and heart health.   

## 2012-03-11 NOTE — Telephone Encounter (Signed)
PT'S WIFE IS A NURSE AND HAS QUESTIONS

## 2012-03-11 NOTE — Telephone Encounter (Signed)
Let her know I can call Friday since I'm off tomorrow.  He is actually Dr .Jola Babinski patient, so she may want to talk to him if he will call her.  Otherwise I'll call Friday

## 2012-03-12 NOTE — Telephone Encounter (Signed)
i spoke to wife.  The only discrepancy was on HTN medication.  He will take HCTZ 25mg  1/2 tablet daily along with 1 Kdur 10mg  daily, recheck BMET and BP check with nurse in 3-4 wk.

## 2012-03-13 ENCOUNTER — Other Ambulatory Visit: Payer: Self-pay | Admitting: Medical

## 2012-03-13 DIAGNOSIS — E876 Hypokalemia: Secondary | ICD-10-CM

## 2012-06-05 ENCOUNTER — Other Ambulatory Visit: Payer: Self-pay | Admitting: Medical

## 2012-06-29 DIAGNOSIS — L821 Other seborrheic keratosis: Secondary | ICD-10-CM | POA: Diagnosis not present

## 2012-06-29 DIAGNOSIS — D2330 Other benign neoplasm of skin of unspecified part of face: Secondary | ICD-10-CM | POA: Diagnosis not present

## 2012-08-04 ENCOUNTER — Ambulatory Visit (INDEPENDENT_AMBULATORY_CARE_PROVIDER_SITE_OTHER): Payer: Medicare Other | Admitting: Family Medicine

## 2012-08-04 ENCOUNTER — Encounter: Payer: Self-pay | Admitting: Family Medicine

## 2012-08-04 VITALS — BP 124/80 | HR 89 | Wt 174.0 lb

## 2012-08-04 DIAGNOSIS — S91009A Unspecified open wound, unspecified ankle, initial encounter: Secondary | ICD-10-CM | POA: Diagnosis not present

## 2012-08-04 DIAGNOSIS — H00019 Hordeolum externum unspecified eye, unspecified eyelid: Secondary | ICD-10-CM | POA: Diagnosis not present

## 2012-08-04 DIAGNOSIS — L723 Sebaceous cyst: Secondary | ICD-10-CM

## 2012-08-04 DIAGNOSIS — H00016 Hordeolum externum left eye, unspecified eyelid: Secondary | ICD-10-CM

## 2012-08-04 DIAGNOSIS — S81809A Unspecified open wound, unspecified lower leg, initial encounter: Secondary | ICD-10-CM | POA: Diagnosis not present

## 2012-08-04 DIAGNOSIS — S81009A Unspecified open wound, unspecified knee, initial encounter: Secondary | ICD-10-CM

## 2012-08-04 DIAGNOSIS — S81011A Laceration without foreign body, right knee, initial encounter: Secondary | ICD-10-CM

## 2012-08-04 NOTE — Patient Instructions (Signed)
Heat to your eye care 4 times per day . If it does not go away, call me and I will refer you to an ophthalmologist.

## 2012-08-04 NOTE — Progress Notes (Signed)
  Subjective:    Patient ID: LENNIS KORB, male    DOB: 1945/11/15, 67 y.o.   MRN: [id number]  HPI He is here for evaluation of multiple issues. He did sustain a superficial laceration to the right knee and took care of it himself. He is here to have me evaluate this. He also has a lesion present on the left thigh that he would like me to look at as well as a lesion behind his right ear.   Review of Systems     Objective:   Physical Exam Left upper mid eyelid does have a red 1 cm lesion. Anterior chamber and cornea are normal. Exam of the right posterior ear does show a 0.5 cm cystic lesion. Right knee shows a horizontal 2 cm laceration is healing well with no evidence of infection       Assessment & Plan:  Knee laceration, right, initial encounter  Stye, left  Sebaceous cyst  recommend he do need to use a butterfly dressing on his knee for the next several days. Recommend heat to GI 3 or 4 times per day. No therapy for the cyst unless it becomes infected. Did write a prescription for him to get TDaP at the drugstore.

## 2012-08-27 ENCOUNTER — Ambulatory Visit (INDEPENDENT_AMBULATORY_CARE_PROVIDER_SITE_OTHER): Payer: Medicare Other | Admitting: Family Medicine

## 2012-08-27 ENCOUNTER — Encounter: Payer: Self-pay | Admitting: Family Medicine

## 2012-08-27 VITALS — BP 138/90 | HR 64 | Ht 69.0 in | Wt 176.0 lb

## 2012-08-27 DIAGNOSIS — Z833 Family history of diabetes mellitus: Secondary | ICD-10-CM

## 2012-08-27 DIAGNOSIS — I498 Other specified cardiac arrhythmias: Secondary | ICD-10-CM

## 2012-08-27 DIAGNOSIS — I10 Essential (primary) hypertension: Secondary | ICD-10-CM

## 2012-08-27 DIAGNOSIS — Z79899 Other long term (current) drug therapy: Secondary | ICD-10-CM | POA: Diagnosis not present

## 2012-08-27 DIAGNOSIS — Z8249 Family history of ischemic heart disease and other diseases of the circulatory system: Secondary | ICD-10-CM

## 2012-08-27 DIAGNOSIS — E039 Hypothyroidism, unspecified: Secondary | ICD-10-CM

## 2012-08-27 DIAGNOSIS — R001 Bradycardia, unspecified: Secondary | ICD-10-CM

## 2012-08-27 LAB — CBC WITH DIFFERENTIAL/PLATELET
Basophils Absolute: 0.1 10*3/uL (ref 0.0–0.1)
Basophils Relative: 1 % (ref 0–1)
Eosinophils Absolute: 0.2 10*3/uL (ref 0.0–0.7)
Hemoglobin: 17.3 g/dL — ABNORMAL HIGH (ref 13.0–17.0)
MCH: 33.2 pg (ref 26.0–34.0)
MCHC: 34.9 g/dL (ref 30.0–36.0)
Monocytes Absolute: 0.6 10*3/uL (ref 0.1–1.0)
Monocytes Relative: 10 % (ref 3–12)
Neutro Abs: 4.3 10*3/uL (ref 1.7–7.7)
Neutrophils Relative %: 68 % (ref 43–77)
RDW: 13.4 % (ref 11.5–15.5)

## 2012-08-27 NOTE — Progress Notes (Signed)
  Subjective:    Patient ID: Neil Rivera, male    DOB: Oct 18, 1945, 67 y.o.   MRN: [id number]  HPI He is here for a medication check. He does have a history of hypothyroidism and is doing well on his present medication. He also has hypertension and presently is on HCTZ 12.5. In the past he was on Ziac. He continues on Lipitor and is having no difficulty with this. He does not smoke. Exercise is usually with walking. His work and home life are going well. Family and social history was otherwise unremarkable.   Review of Systems Negative except as above    Objective:   Physical Exam BP 138/90  Pulse 64  Ht 5\' 9"  (1.753 m)  Wt 176 lb (79.833 kg)  BMI 25.98 kg/m2  General Appearance:    Alert, cooperative, no distress, appears stated age  Head:    Normocephalic, without obvious abnormality, atraumatic  Eyes:    PERRL, conjunctiva/corneas clear, EOM's intact, fundi    benign  Ears:    Normal TM's and external ear canals  Nose:   Nares normal, mucosa normal, no drainage or sinus   tenderness  Throat:   Lips, mucosa, and tongue normal; teeth and gums normal  Neck:   Supple, no lymphadenopathy;  thyroid:  no   enlargement/tenderness/nodules; no carotid   bruit or JVD  Back:    Spine nontender, no curvature, ROM normal, no CVA     tenderness  Lungs:     Clear to auscultation bilaterally without wheezes, rales or     ronchi; respirations unlabored  Chest Wall:    No tenderness or deformity   Heart:    Regular rate and rhythm, S1 and S2 normal, no murmur, rub   or gallop  Breast Exam:    No chest wall tenderness, masses or gynecomastia  Abdomen:     Soft, non-tender, nondistended, normoactive bowel sounds,    no masses, no hepatosplenomegaly        Extremities:   No clubbing, cyanosis or edema  Pulses:   2+ and symmetric all extremities  Skin:   Skin color, texture, turgor normal, no rashes or lesions  Lymph nodes:   Cervical, supraclavicular, and axillary nodes normal  Neurologic:    CNII-XII intact, normal strength, sensation and gait; reflexes 2+ and symmetric throughout          Psych:   Normal mood, affect, hygiene and grooming.          Assessment & Plan:  Hypothyroid - Plan: TSH  Hypertension - Plan: CBC with Differential, Comprehensive metabolic panel  Family history of heart disease in male family member before age 38 - Plan: CBC with Differential, Comprehensive metabolic panel, Lipid panel  Family history of diabetes mellitus - Plan: CBC with Differential, Comprehensive metabolic panel, Lipid panel  Bradycardia  Encounter for long-term (current) use of other medications - Plan: CBC with Differential, Comprehensive metabolic panel, Lipid panel, TSH  encouraged him to stay physically active. He has no immediate plans for retirement. Continue on his present medication regimen.

## 2012-08-27 NOTE — Patient Instructions (Signed)
Take a good multivitamin every day

## 2012-08-28 LAB — COMPREHENSIVE METABOLIC PANEL
AST: 25 U/L (ref 0–37)
Alkaline Phosphatase: 91 U/L (ref 39–117)
BUN: 19 mg/dL (ref 6–23)
Glucose, Bld: 87 mg/dL (ref 70–99)
Sodium: 141 mEq/L (ref 135–145)
Total Bilirubin: 0.8 mg/dL (ref 0.3–1.2)

## 2012-08-28 LAB — LIPID PANEL
HDL: 57 mg/dL (ref >39)
LDL Cholesterol: 86 mg/dL (ref 0–99)
Total CHOL/HDL Ratio: 2.8 Ratio
Triglycerides: 80 mg/dL (ref <150)
VLDL: 16 mg/dL (ref 0–40)

## 2012-08-28 LAB — TSH: TSH: 0.984 u[IU]/mL (ref 0.350–4.500)

## 2012-08-28 MED ORDER — ATORVASTATIN CALCIUM 20 MG PO TABS: ORAL_TABLET | ORAL | Status: DC

## 2012-08-28 MED ORDER — HYDROCHLOROTHIAZIDE 25 MG PO TABS: 12.5000 mg | ORAL_TABLET | Freq: Every day | ORAL | Status: DC

## 2012-08-28 MED ORDER — LEVOTHYROXINE SODIUM 100 MCG PO TABS: ORAL_TABLET | ORAL | Status: DC

## 2012-08-28 NOTE — Addendum Note (Signed)
Addended by: Ronnald Nian on: 08/28/2012 03:16 PM   Modules accepted: Orders

## 2012-09-14 ENCOUNTER — Telehealth: Payer: Self-pay | Admitting: Family Medicine

## 2012-09-14 NOTE — Telephone Encounter (Signed)
Labs all looked fine.   C/t present medications.   Not sure what followup he and Dr. Susann Givens discussed, but return as they discussed.

## 2012-09-15 NOTE — Telephone Encounter (Signed)
I left a detailed message on the patients voicemail. CLS

## 2012-11-16 ENCOUNTER — Ambulatory Visit (INDEPENDENT_AMBULATORY_CARE_PROVIDER_SITE_OTHER): Payer: Medicare Other | Admitting: Family Medicine

## 2012-11-16 ENCOUNTER — Encounter: Payer: Self-pay | Admitting: Family Medicine

## 2012-11-16 VITALS — BP 140/90 | HR 72 | Wt 175.0 lb

## 2012-11-16 DIAGNOSIS — Z23 Encounter for immunization: Secondary | ICD-10-CM | POA: Diagnosis not present

## 2012-11-16 DIAGNOSIS — L989 Disorder of the skin and subcutaneous tissue, unspecified: Secondary | ICD-10-CM

## 2012-11-16 DIAGNOSIS — M722 Plantar fascial fibromatosis: Secondary | ICD-10-CM | POA: Diagnosis not present

## 2012-11-16 DIAGNOSIS — R040 Epistaxis: Secondary | ICD-10-CM

## 2012-11-16 NOTE — Patient Instructions (Signed)
Plantar Fasciitis Plantar fasciitis is a common condition that causes foot pain. It is soreness (inflammation) of the band of tough fibrous tissue on the bottom of the foot that runs from the heel bone (calcaneus) to the ball of the foot. The cause of this soreness may be from excessive standing, poor fitting shoes, running on hard surfaces, being overweight, having an abnormal walk, or overuse (this is common in runners) of the painful foot or feet. It is also common in aerobic exercise dancers and ballet dancers. SYMPTOMS  Most people with plantar fasciitis complain of:  Severe pain in the morning on the bottom of their foot especially when taking the first steps out of bed. This pain recedes after a few minutes of walking.  Severe pain is experienced also during walking following a long period of inactivity.  Pain is worse when walking barefoot or up stairs DIAGNOSIS   Your caregiver will diagnose this condition by examining and feeling your foot.  Special tests such as X-rays of your foot, are usually not needed. PREVENTION   Consult a sports medicine professional before beginning a new exercise program.  Walking programs offer a good workout. With walking there is a lower chance of overuse injuries common to runners. There is less impact and less jarring of the joints.  Begin all new exercise programs slowly. If problems or pain develop, decrease the amount of time or distance until you are at a comfortable level.  Wear good shoes and replace them regularly.  Stretch your foot and the heel cords at the back of the ankle (Achilles tendon) both before and after exercise.  Run or exercise on even surfaces that are not hard. For example, asphalt is better than pavement.  Do not run barefoot on hard surfaces.  If using a treadmill, vary the incline.  Do not continue to workout if you have foot or joint problems. Seek professional help if they do not improve. HOME CARE INSTRUCTIONS     Avoid activities that cause you pain until you recover.  Use ice or cold packs on the problem or painful areas after working out.  Only take over-the-counter or prescription medicines for pain, discomfort, or fever as directed by your caregiver.  Soft shoe inserts or athletic shoes with air or gel sole cushions may be helpful.  If problems continue or become more severe, consult a sports medicine caregiver or your own health care provider. Cortisone is a potent anti-inflammatory medication that may be injected into the painful area. You can discuss this treatment with your caregiver. MAKE SURE YOU:   Understand these instructions.  Will watch your condition.  Will get help right away if you are not doing well or get worse. Document Released: 10/02/2000 Document Revised: 04/01/2011 Document Reviewed: 12/02/2007 [facility] Patient Information 2014 [place], [place]. Stretch before you get out of bed in the morning. Use heel cups on both sides. Do this for several weeks and if that doesn't work then switch to arch supports but make sure you get firm arch supports of cork  The next time you have a nose bleed lean forward and pinch your nose about 5 minutes

## 2012-11-16 NOTE — Progress Notes (Signed)
  Subjective:    Patient ID: Neil Rivera, male    DOB: Jun 14, 1945, 67 y.o.   MRN: [id number]  HPI He has had 4 episodes of epistaxis, 2 of which occurred after sneezing. No history of recent cold. No easy bruisability, blood in his stool or urine. All bleeding was on the right side. He also has a lesion on his upper back it was causing some itching. He also complains of a one-month history of right heel pain. He notes this is especially bad after he sits for long periods of time.   Review of Systems     Objective:   Physical Exam Joneen Boers and in no distress. Nasal mucosal exam is normal. Throat is clear. TMs clear. Neck is supple without adenopathy. He does have a lesion present on the mid upper back between the scapulas that is healing but does not have any or pearly nature or pigment changes. He is tender to palpation over the right calcaneal spur. No under tenderness to palpation. Full motion of the ankle.       Assessment & Plan:  Need for prophylactic vaccination and inoculation against influenza - Plan: Flu vaccine HIGH DOSE PF (Fluzone Tri High dose)  Epistaxis  Plantar fasciitis of right foot  Skin lesion of back  Discussed treatment next time he has a nosebleed. Recommend returning here in several weeks for reevaluation of the skin lesion and encouraged him to not scratch this I can see it in its natural state. Discussed plantar fasciitis in regard to heel cups, arts supports, stretching.

## 2013-01-11 ENCOUNTER — Emergency Department (HOSPITAL_COMMUNITY)
Admission: EM | Admit: 2013-01-11 | Discharge: 2013-01-11 | Disposition: A | Discharge status: Home / Self Care | Payer: Medicare Other | Attending: Emergency Medicine | Admitting: Emergency Medicine

## 2013-01-11 ENCOUNTER — Emergency Department (HOSPITAL_COMMUNITY): Payer: Medicare Other

## 2013-01-11 DIAGNOSIS — Y9289 Other specified places as the place of occurrence of the external cause: Secondary | ICD-10-CM | POA: Insufficient documentation

## 2013-01-11 DIAGNOSIS — E7800 Pure hypercholesterolemia, unspecified: Secondary | ICD-10-CM | POA: Diagnosis not present

## 2013-01-11 DIAGNOSIS — Z79899 Other long term (current) drug therapy: Secondary | ICD-10-CM | POA: Diagnosis not present

## 2013-01-11 DIAGNOSIS — I10 Essential (primary) hypertension: Secondary | ICD-10-CM | POA: Insufficient documentation

## 2013-01-11 DIAGNOSIS — Z7982 Long term (current) use of aspirin: Secondary | ICD-10-CM | POA: Diagnosis not present

## 2013-01-11 DIAGNOSIS — Z860100 Personal history of colon polyps, unspecified: Secondary | ICD-10-CM | POA: Insufficient documentation

## 2013-01-11 DIAGNOSIS — S0993XA Unspecified injury of face, initial encounter: Secondary | ICD-10-CM | POA: Insufficient documentation

## 2013-01-11 DIAGNOSIS — E039 Hypothyroidism, unspecified: Secondary | ICD-10-CM | POA: Insufficient documentation

## 2013-01-11 DIAGNOSIS — S92309A Fracture of unspecified metatarsal bone(s), unspecified foot, initial encounter for closed fracture: Secondary | ICD-10-CM | POA: Insufficient documentation

## 2013-01-11 DIAGNOSIS — S92301A Fracture of unspecified metatarsal bone(s), right foot, initial encounter for closed fracture: Secondary | ICD-10-CM

## 2013-01-11 DIAGNOSIS — Y9302 Activity, running: Secondary | ICD-10-CM | POA: Insufficient documentation

## 2013-01-11 DIAGNOSIS — W010XXA Fall on same level from slipping, tripping and stumbling without subsequent striking against object, initial encounter: Secondary | ICD-10-CM | POA: Insufficient documentation

## 2013-01-11 MED ORDER — HYDROCODONE-ACETAMINOPHEN 5-325 MG PO TABS: 1.0000 | ORAL_TABLET | ORAL | Status: DC | PRN

## 2013-01-11 NOTE — ED Notes (Signed)
Pt states he was running into [employer] and fell and injured his R foot. Pt has pain to lateral side of R foot and also neck pain. Pt states he has not been able to bear wt on R foot.

## 2013-01-11 NOTE — ED Provider Notes (Signed)
CSN: [id number]     Arrival date & time 01/11/13  2058 History  This chart was scribed for non-physician practitioner, Marlon Pel, PA-C,working with Raeford Razor, MD, by Karle Plumber, ED Scribe.  This patient was seen in room WTR6/WTR6 and the patient's care was started at 9:46 PM.  Chief Complaint  Patient presents with  . Foot Injury   The history is provided by the patient. No language interpreter was used.   HPI Comments:  Neil Rivera is a 67 y.o. male who presents to the Emergency Department complaining of slipping and falling in the parking lot of [employer]. Pt states he landed on his back and complains of associated neck pain right after the incident that quickly resolved. Pt denies pain to right foot unless he bears weight, which he has been unable to do. Pt denies numbness or tingling.    Past Medical History  Diagnosis Date  . Hypercholesterolemia   . Allergy     RHINITIS  . Hypertension   . Thyroid disease     HYPOTHYROID  . History of colonic polyps    Past Surgical History  Procedure Laterality Date  . Elbow surgery    . Finger surgery     Family History  Problem Relation Age of Onset  . Heart disease Father   . Diabetes Brother   . Diabetes Maternal Uncle    History  Substance Use Topics  . Smoking status: Never Smoker   . Smokeless tobacco: Not on file  . Alcohol Use: Yes     Comment: rare    Review of Systems  Musculoskeletal: Positive for arthralgias (right foot) and neck pain (resolved ).  Neurological: Negative for numbness.  All other systems reviewed and are negative.    Allergies  Review of patient's allergies indicates no known allergies.  Home Medications   Current Outpatient Rx  Name  Route  Sig  Dispense  Refill  . aspirin 81 MG tablet   Oral   Take 81 mg by mouth daily.           Marland Kitchen atorvastatin (LIPITOR) 20 MG tablet      TAKE 1 TABLET (20 MG TOTAL) BY MOUTH DAILY.   90 tablet   3   . hydrochlorothiazide  (HYDRODIURIL) 25 MG tablet   Oral   Take 0.5 tablets (12.5 mg total) by mouth daily.   90 tablet   3   . ibuprofen (ADVIL,MOTRIN) 200 MG tablet   Oral   Take 200 mg by mouth every 6 (six) hours as needed (pain).         Marland Kitchen levothyroxine (SYNTHROID, LEVOTHROID) 100 MCG tablet      TAKE 1 TABLET (100 MCG TOTAL) BY MOUTH DAILY.   90 tablet   3   . Multiple Vitamins-Minerals (MULTIVITAMIN WITH MINERALS) tablet   Oral   Take 1 tablet by mouth daily.         Marland Kitchen HYDROcodone-acetaminophen (NORCO/VICODIN) 5-325 MG per tablet   Oral   Take 1-2 tablets by mouth every 4 (four) hours as needed.   20 tablet   0   . EXPIRED: potassium chloride (K-DUR) 10 MEQ tablet   Oral   Take 1 tablet (10 mEq total) by mouth daily.   30 tablet   5    Triage Vitals: BP 148/92  Pulse 99  Temp(Src) 99.3 F (37.4 C) (Oral)  Resp 18  SpO2 98% Physical Exam  Nursing note and vitals reviewed. Constitutional: He is oriented  to person, place, and time. He appears well-developed and well-nourished.  HENT:  Head: Normocephalic and atraumatic.  Eyes: EOM are normal.  Neck: Normal range of motion. Muscular tenderness present. No spinous process tenderness present. No rigidity. Normal range of motion present.    Cardiovascular: Normal rate.   Pulmonary/Chest: Effort normal.  Musculoskeletal:       Right foot: He exhibits tenderness, bony tenderness and swelling. He exhibits normal range of motion, normal capillary refill, no crepitus, no deformity and no laceration.       Feet:  Neurological: He is alert and oriented to person, place, and time.  Skin: Skin is warm and dry.  Psychiatric: He has a normal mood and affect. His behavior is normal.    ED Course  Procedures (including critical care time) DIAGNOSTIC STUDIES: Oxygen Saturation is 98% on RA, normal by my interpretation.   COORDINATION OF CARE: 9:50 PM- Will provide a post-op boot and crutches. Will give pt referral to orthopedist.  Advised pt to rest, ice and elevate his foot. Will provide prescription for Vicodin for pain control. Offered pain medication here, but pt refused. Pt verbalizes understanding and agrees to plan.  Medications - No data to display  Labs Review Labs Reviewed - No data to display Imaging Review Dg Foot Complete Right  01/11/2013   CLINICAL DATA:  Traumatic injury with pain  EXAM: RIGHT FOOT COMPLETE - 3+ VIEW  COMPARISON:  None.  FINDINGS: There is a minimally displaced fracture through the base of the right 5th metatarsal. Mild soft tissue edema is noted. No other focal abnormality is seen.  IMPRESSION: Fifth metatarsal fracture   Electronically Signed   By: Alcide Clever M.D.   On: 01/11/2013 21:40    EKG Interpretation   None       MDM   1. Fracture of 5th metatarsal, right, closed, initial encounter    67 y.o.[name]'s evaluation in the Emergency Department is complete. It has been determined that no acute conditions requiring further emergency intervention are present at this time. The patient/guardian have been advised of the diagnosis and plan. We have discussed signs and symptoms that warrant return to the ED, such as changes or worsening in symptoms.  Vital signs are stable at discharge. Filed Vitals:   01/11/13 2103  BP: 148/92  Pulse: 99  Temp: 99.3 F (37.4 C)  Resp: 18    Patient/guardian has voiced understanding and agreed to follow-up with the PCP or specialist.  I personally performed the services described in this documentation, which was scribed in my presence. The recorded information has been reviewed and is accurate.    Dorthula Matas, PA-C 01/11/13 2246

## 2013-01-19 NOTE — ED Provider Notes (Signed)
Medical screening examination/treatment/procedure(s) were performed by non-physician practitioner and as supervising physician I was immediately available for consultation/collaboration.  EKG Interpretation   None        Isaak Delmundo, MD 01/19/13 2200 

## 2013-01-26 DIAGNOSIS — S92309A Fracture of unspecified metatarsal bone(s), unspecified foot, initial encounter for closed fracture: Secondary | ICD-10-CM | POA: Diagnosis not present

## 2013-01-26 DIAGNOSIS — M25579 Pain in unspecified ankle and joints of unspecified foot: Secondary | ICD-10-CM | POA: Diagnosis not present

## 2013-02-16 DIAGNOSIS — M25579 Pain in unspecified ankle and joints of unspecified foot: Secondary | ICD-10-CM | POA: Diagnosis not present

## 2013-03-09 DIAGNOSIS — M25579 Pain in unspecified ankle and joints of unspecified foot: Secondary | ICD-10-CM | POA: Diagnosis not present

## 2013-04-15 DIAGNOSIS — M25579 Pain in unspecified ankle and joints of unspecified foot: Secondary | ICD-10-CM | POA: Diagnosis not present

## 2013-06-12 ENCOUNTER — Other Ambulatory Visit: Payer: Self-pay | Admitting: Family Medicine

## 2013-06-17 ENCOUNTER — Other Ambulatory Visit: Payer: Self-pay | Admitting: Dermatology

## 2013-06-17 DIAGNOSIS — L980 Pyogenic granuloma: Secondary | ICD-10-CM | POA: Diagnosis not present

## 2013-06-17 DIAGNOSIS — L820 Inflamed seborrheic keratosis: Secondary | ICD-10-CM | POA: Diagnosis not present

## 2013-06-17 DIAGNOSIS — L259 Unspecified contact dermatitis, unspecified cause: Secondary | ICD-10-CM | POA: Diagnosis not present

## 2013-06-17 DIAGNOSIS — D1801 Hemangioma of skin and subcutaneous tissue: Secondary | ICD-10-CM | POA: Diagnosis not present

## 2013-06-29 ENCOUNTER — Encounter: Payer: Self-pay | Admitting: Family Medicine

## 2013-06-29 ENCOUNTER — Ambulatory Visit (INDEPENDENT_AMBULATORY_CARE_PROVIDER_SITE_OTHER): Payer: Medicare Other | Admitting: Family Medicine

## 2013-06-29 VITALS — BP 130/90 | HR 60 | Wt 176.0 lb

## 2013-06-29 DIAGNOSIS — I10 Essential (primary) hypertension: Secondary | ICD-10-CM | POA: Diagnosis not present

## 2013-06-29 DIAGNOSIS — M719 Bursopathy, unspecified: Secondary | ICD-10-CM

## 2013-06-29 DIAGNOSIS — M7552 Bursitis of left shoulder: Secondary | ICD-10-CM

## 2013-06-29 DIAGNOSIS — Z8249 Family history of ischemic heart disease and other diseases of the circulatory system: Secondary | ICD-10-CM

## 2013-06-29 DIAGNOSIS — Z23 Encounter for immunization: Secondary | ICD-10-CM | POA: Diagnosis not present

## 2013-06-29 DIAGNOSIS — E039 Hypothyroidism, unspecified: Secondary | ICD-10-CM | POA: Diagnosis not present

## 2013-06-29 DIAGNOSIS — Z86010 Personal history of colonic polyps: Secondary | ICD-10-CM

## 2013-06-29 DIAGNOSIS — E785 Hyperlipidemia, unspecified: Secondary | ICD-10-CM

## 2013-06-29 DIAGNOSIS — M67919 Unspecified disorder of synovium and tendon, unspecified shoulder: Secondary | ICD-10-CM

## 2013-06-29 DIAGNOSIS — Z833 Family history of diabetes mellitus: Secondary | ICD-10-CM

## 2013-06-29 LAB — COMPREHENSIVE METABOLIC PANEL WITH GFR
ALT: 30 U/L (ref 0–53)
AST: 21 U/L (ref 0–37)
Albumin: 4.3 g/dL (ref 3.5–5.2)
Alkaline Phosphatase: 81 U/L (ref 39–117)
BUN: 19 mg/dL (ref 6–23)
CO2: 27 meq/L (ref 19–32)
Calcium: 9.3 mg/dL (ref 8.4–10.5)
Chloride: 105 meq/L (ref 96–112)
Creat: 0.86 mg/dL (ref 0.50–1.35)
Glucose, Bld: 99 mg/dL (ref 70–99)
Potassium: 3.6 meq/L (ref 3.5–5.3)
Sodium: 141 meq/L (ref 135–145)
Total Bilirubin: 1.1 mg/dL (ref 0.2–1.2)
Total Protein: 6.7 g/dL (ref 6.0–8.3)

## 2013-06-29 LAB — CBC WITH DIFFERENTIAL/PLATELET
Basophils Absolute: 0.1 10*3/uL (ref 0.0–0.1)
Basophils Relative: 1 % (ref 0–1)
Eosinophils Absolute: 0.3 10*3/uL (ref 0.0–0.7)
Eosinophils Relative: 4 % (ref 0–5)
HCT: 47 % (ref 39.0–52.0)
Hemoglobin: 16.7 g/dL (ref 13.0–17.0)
Lymphocytes Relative: 18 % (ref 12–46)
Lymphs Abs: 1.2 10*3/uL (ref 0.7–4.0)
MCH: 33.2 pg (ref 26.0–34.0)
MCHC: 35.5 g/dL (ref 30.0–36.0)
MCV: 93.4 fL (ref 78.0–100.0)
Monocytes Absolute: 0.5 10*3/uL (ref 0.1–1.0)
Monocytes Relative: 8 % (ref 3–12)
Neutro Abs: 4.7 10*3/uL (ref 1.7–7.7)
Neutrophils Relative %: 69 % (ref 43–77)
Platelets: 164 10*3/uL (ref 150–400)
RBC: 5.03 MIL/uL (ref 4.22–5.81)
RDW: 13.6 % (ref 11.5–15.5)
WBC: 6.8 10*3/uL (ref 4.0–10.5)

## 2013-06-29 LAB — TSH: TSH: 1.183 u[IU]/mL (ref 0.350–4.500)

## 2013-06-29 LAB — LIPID PANEL
CHOL/HDL RATIO: 2.9 ratio
CHOLESTEROL: 143 mg/dL (ref 0–200)
HDL: 50 mg/dL (ref >39)
LDL CALC: 71 mg/dL (ref 0–99)
Triglycerides: 109 mg/dL (ref <150)
VLDL: 22 mg/dL (ref 0–40)

## 2013-06-29 MED ORDER — ATORVASTATIN CALCIUM 20 MG PO TABS: ORAL_TABLET | ORAL | Status: DC

## 2013-06-29 MED ORDER — HYDROCHLOROTHIAZIDE 25 MG PO TABS: 12.5000 mg | ORAL_TABLET | Freq: Every day | ORAL | Status: DC

## 2013-06-29 MED ORDER — LEVOTHYROXINE SODIUM 100 MCG PO TABS: ORAL_TABLET | ORAL | Status: DC

## 2013-06-29 NOTE — Patient Instructions (Signed)
Cut back on white food 

## 2013-06-29 NOTE — Progress Notes (Signed)
Subjective:    Patient ID: Neil Rivera, male    DOB: [birth year], 68 y.o.   MRN: [id number]  HPI He is here for a medication check. He continues on his blood pressure medication as well as Lipitor and is having no difficulty with them. He continues on Synthroid. He does have a family history of diabetes. He also has a history of colonic polyps and will have a followup colonoscopy in 2017. His immunizations were reviewed. He recently saw a dermatologist and had a lesion removed. He does complain of left shoulder pain but usually only bothers him in the morning when he lifts something over his head. He has not interfered with his ADLs. There is a family history of heart disease however it was his father and he had an MI at age 78. Presently he is exercising but has had no chest pain, DOE, PND.   Review of Systems  All other systems reviewed and are negative.      Objective:   Physical Exam BP 130/90  Pulse 60  Wt 176 lb (79.833 kg)  General Appearance:    Alert, cooperative, no distress, appears stated age  Head:    Normocephalic, without obvious abnormality, atraumatic  Eyes:    PERRL, conjunctiva/corneas clear, EOM's intact, fundi    benign  Ears:    Normal TM's and external ear canals  Nose:   Nares normal, mucosa normal, no drainage or sinus   tenderness  Throat:   Lips, mucosa, and tongue normal; teeth and gums normal  Neck:   Supple, no lymphadenopathy;  thyroid:  no   enlargement/tenderness/nodules; no carotid   bruit or JVD  Back:    Spine nontender, no curvature, ROM normal, no CVA     tenderness  Lungs:     Clear to auscultation bilaterally without wheezes, rales or     ronchi; respirations unlabored  Chest Wall:    No tenderness or deformity   Heart:    Regular rate and rhythm, S1 and S2 normal, no murmur, rub   or gallop  Breast Exam:    No chest wall tenderness, masses or gynecomastia  Abdomen:     Soft, non-tender, nondistended, normoactive bowel sounds,    no  masses, no hepatosplenomegaly        Extremities:   No clubbing, cyanosis or edema. A round smooth movable lesion is noted on the left index finger near the DIP joint. Left shoulder exam does show discomfort with supraspinatus testing. No laxity noted. No tenderness palpation. Negative Neer's and Hawkins.   Pulses:   2+ and symmetric all extremities  Skin:   Skin color, texture, turgor normal, no rashes or lesions  Lymph nodes:   Cervical, supraclavicular, and axillary nodes normal  Neurologic:   CNII-XII intact, normal strength, sensation and gait; reflexes 2+ and symmetric throughout          Psych:   Normal mood, affect, hygiene and grooming.          Assessment & Plan:  Personal history of colonic polyps  Hypertension - Plan: CBC with Differential, Comprehensive metabolic panel, hydrochlorothiazide (HYDRODIURIL) 25 MG tablet  Hypothyroid - Plan: TSH, levothyroxine (SYNTHROID, LEVOTHROID) 100 MCG tablet  Family history of heart disease in male family member before age 66 - Plan: CBC with Differential, Comprehensive metabolic panel, Lipid panel  Family history of diabetes mellitus  Need for prophylactic vaccination against Streptococcus pneumoniae (pneumococcus) - Plan: Pneumococcal conjugate vaccine 13-valent  Hyperlipidemia LDL goal < 100 -  Plan: Lipid panel, atorvastatin (LIPITOR) 20 MG tablet  Bursitis of left shoulder  discussed the left shoulder pain. Since she's not having much difficulty, no further intervention is really needed. If he has more trouble he will call for possible injection. His immunizations were updated. Explained that the cystic lesion on his fingers nothing to be concerned about. Did recommend increasing his physical activity and cutting back on carbohydrates.

## 2013-07-19 DIAGNOSIS — L980 Pyogenic granuloma: Secondary | ICD-10-CM | POA: Diagnosis not present

## 2013-10-07 DIAGNOSIS — H5210 Myopia, unspecified eye: Secondary | ICD-10-CM | POA: Diagnosis not present

## 2013-10-07 DIAGNOSIS — H524 Presbyopia: Secondary | ICD-10-CM | POA: Diagnosis not present

## 2013-10-07 DIAGNOSIS — D3130 Benign neoplasm of unspecified choroid: Secondary | ICD-10-CM | POA: Diagnosis not present

## 2013-12-22 DIAGNOSIS — F4321 Adjustment disorder with depressed mood: Secondary | ICD-10-CM | POA: Diagnosis not present

## 2014-01-04 DIAGNOSIS — F4321 Adjustment disorder with depressed mood: Secondary | ICD-10-CM | POA: Diagnosis not present

## 2014-01-26 DIAGNOSIS — F4321 Adjustment disorder with depressed mood: Secondary | ICD-10-CM | POA: Diagnosis not present

## 2014-02-17 DIAGNOSIS — F419 Anxiety disorder, unspecified: Secondary | ICD-10-CM | POA: Diagnosis not present

## 2014-02-18 DIAGNOSIS — L300 Nummular dermatitis: Secondary | ICD-10-CM | POA: Diagnosis not present

## 2014-03-09 DIAGNOSIS — F419 Anxiety disorder, unspecified: Secondary | ICD-10-CM | POA: Diagnosis not present

## 2014-03-24 DIAGNOSIS — F419 Anxiety disorder, unspecified: Secondary | ICD-10-CM | POA: Diagnosis not present

## 2014-04-19 DIAGNOSIS — F419 Anxiety disorder, unspecified: Secondary | ICD-10-CM | POA: Diagnosis not present

## 2014-05-11 DIAGNOSIS — F419 Anxiety disorder, unspecified: Secondary | ICD-10-CM | POA: Diagnosis not present

## 2014-05-31 ENCOUNTER — Other Ambulatory Visit: Payer: Self-pay | Admitting: Family Medicine

## 2014-06-01 DIAGNOSIS — F419 Anxiety disorder, unspecified: Secondary | ICD-10-CM | POA: Diagnosis not present

## 2014-06-07 DIAGNOSIS — F419 Anxiety disorder, unspecified: Secondary | ICD-10-CM | POA: Diagnosis not present

## 2014-06-14 DIAGNOSIS — F419 Anxiety disorder, unspecified: Secondary | ICD-10-CM | POA: Diagnosis not present

## 2014-06-21 DIAGNOSIS — F419 Anxiety disorder, unspecified: Secondary | ICD-10-CM | POA: Diagnosis not present

## 2014-06-28 ENCOUNTER — Ambulatory Visit (INDEPENDENT_AMBULATORY_CARE_PROVIDER_SITE_OTHER): Payer: Medicare Other | Admitting: Internal Medicine

## 2014-06-28 VITALS — BP 130/90 | HR 83 | Temp 98.7°F | Resp 18 | Ht 68.75 in | Wt 171.4 lb

## 2014-06-28 DIAGNOSIS — K1121 Acute sialoadenitis: Secondary | ICD-10-CM

## 2014-06-28 MED ORDER — AMOXICILLIN 875 MG PO TABS: 875.0000 mg | ORAL_TABLET | Freq: Two times a day (BID) | ORAL | Status: DC

## 2014-06-28 NOTE — Addendum Note (Signed)
Addended by: Leandrew Koyanagi on: 06/28/2014 09:30 PM   Modules accepted: Level of Service

## 2014-06-28 NOTE — Progress Notes (Signed)
   Subjective:    Patient ID: Neil Rivera, male    DOB: Jun 13, 1945, 69 y.o.   MRN: [id number] This chart was scribed for Neil Lin, MD by Marti Sleigh, Medical Scribe. This patient was seen in Room 14 and the patient's care was started a 8:22 PM.  Chief Complaint  Patient presents with  . Facial Swelling    x 2 days    HPI HPI Comments: Neil Rivera is a 69 y.o. male who presents to [facility] complaining of right sided facial swelling for the last 24hrs. Pt states it is tender with palpation. Pt states he feels mild pain with yawning, but no pain with biting down. Pt denies pain in the right ear, or change in hearing. Pt denies fever or cough.   Patient Active Problem List   Diagnosis Date Noted  . Hyperlipidemia LDL goal < 100 06/29/2013  . Bradycardia 11/08/2010  . Hypertension 10/05/2010  . Hypothyroid 10/05/2010  . Family history of heart disease in male family member before age 65 10/05/2010  . Family history of diabetes mellitus 10/05/2010    Review of Systems  Constitutional: Negative for fever and chills.  HENT: Positive for facial swelling. Negative for dental problem, ear pain, rhinorrhea, sinus pressure and trouble swallowing.   Respiratory: Negative for cough.   Neurological: Negative for dizziness, numbness and headaches.       Objective:   Physical Exam  Constitutional: He is oriented to person, place, and time. He appears well-developed and well-nourished. No distress.  HENT:  Head: Normocephalic and atraumatic.  Right Ear: External ear normal.  Left Ear: External ear normal.  Nose: Nose normal.  Mouth/Throat: Oropharynx is clear and moist.  At the angle of the jaw there is mild swelling with tenderness to palpation over a 3 cm x 2 cm area. The skin is slightly thickened under the mandible and there is no mandibular swelling and no palpable bony  tenderness. The parotid gland feels slightly puffy and slightly tender but Stensen's duct so does no pus  drainage redness. The upper and lower teeth have no evidence for gum abscess or root abscess. TMJ nontender Ear canals and tympanic membrane appear normal with dry flaky wax throughout  Eyes: Conjunctivae and EOM are normal. Pupils are equal, round, and reactive to light.  Neck: Neck supple. No thyromegaly present.  Cardiovascular: Normal rate.   Pulmonary/Chest: Effort normal.  Lymphadenopathy:    He has no cervical adenopathy.  Neurological: He is alert and oriented to person, place, and time. Coordination normal.  Skin: He is not diaphoretic.  Nursing note and vitals reviewed. BP 130/90 mmHg  Pulse 83  Temp(Src) 98.7 F (37.1 C) (Oral)  Resp 18  Ht 5' 8.75" (1.746 m)  Wt 171 lb 7 oz (77.764 kg)  BMI 25.51 kg/m2  SpO2 98%      Assessment & Plan:  Sudden onset in palpable tenderness with thickness suggest an emerging abscess or parotid gland infection at the right mandible  Plan--start amoxicillin and follow closely//if no response he will need CT of the area in 1 week or so 2 follow-up Friday if not improving  I have completed the patient encounter in its entirety as documented by the scribe, with editing by me where necessary. Mavery Milling P. Laney Pastor, M.D.

## 2014-06-29 DIAGNOSIS — F419 Anxiety disorder, unspecified: Secondary | ICD-10-CM | POA: Diagnosis not present

## 2014-06-29 NOTE — Progress Notes (Signed)
Have him follow up with me if he is not getting better

## 2014-06-30 NOTE — Progress Notes (Signed)
Patient was seen in our office 06/30/14

## 2014-07-05 DIAGNOSIS — F419 Anxiety disorder, unspecified: Secondary | ICD-10-CM | POA: Diagnosis not present

## 2014-07-06 ENCOUNTER — Encounter: Payer: Self-pay | Admitting: Family Medicine

## 2014-07-06 ENCOUNTER — Ambulatory Visit (INDEPENDENT_AMBULATORY_CARE_PROVIDER_SITE_OTHER): Payer: Medicare Other | Admitting: Family Medicine

## 2014-07-06 VITALS — BP 124/80 | HR 70 | Ht 68.5 in | Wt 171.0 lb

## 2014-07-06 DIAGNOSIS — E038 Other specified hypothyroidism: Secondary | ICD-10-CM

## 2014-07-06 DIAGNOSIS — E785 Hyperlipidemia, unspecified: Secondary | ICD-10-CM | POA: Diagnosis not present

## 2014-07-06 DIAGNOSIS — Z8249 Family history of ischemic heart disease and other diseases of the circulatory system: Secondary | ICD-10-CM

## 2014-07-06 DIAGNOSIS — Z833 Family history of diabetes mellitus: Secondary | ICD-10-CM

## 2014-07-06 DIAGNOSIS — I10 Essential (primary) hypertension: Secondary | ICD-10-CM

## 2014-07-06 LAB — CBC WITH DIFFERENTIAL/PLATELET
BASOS ABS: 0.1 10*3/uL (ref 0.0–0.1)
Basophils Relative: 1 % (ref 0–1)
EOS PCT: 2 % (ref 0–5)
Eosinophils Absolute: 0.2 10*3/uL (ref 0.0–0.7)
HCT: 49.4 % (ref 39.0–52.0)
HEMOGLOBIN: 16.8 g/dL (ref 13.0–17.0)
LYMPHS ABS: 1.1 10*3/uL (ref 0.7–4.0)
Lymphocytes Relative: 15 % (ref 12–46)
MCH: 32.6 pg (ref 26.0–34.0)
MCHC: 34 g/dL (ref 30.0–36.0)
MCV: 95.9 fL (ref 78.0–100.0)
MONO ABS: 0.8 10*3/uL (ref 0.1–1.0)
MPV: 11.5 fL (ref 8.6–12.4)
Monocytes Relative: 10 % (ref 3–12)
NEUTROS ABS: 5.5 10*3/uL (ref 1.7–7.7)
Neutrophils Relative %: 72 % (ref 43–77)
Platelets: 181 10*3/uL (ref 150–400)
RBC: 5.15 MIL/uL (ref 4.22–5.81)
RDW: 13.6 % (ref 11.5–15.5)
WBC: 7.6 10*3/uL (ref 4.0–10.5)

## 2014-07-06 LAB — LIPID PANEL
CHOLESTEROL: 152 mg/dL (ref 0–200)
HDL: 61 mg/dL (ref >=40)
LDL Cholesterol: 72 mg/dL (ref 0–99)
Total CHOL/HDL Ratio: 2.5 Ratio
Triglycerides: 94 mg/dL (ref <150)
VLDL: 19 mg/dL (ref 0–40)

## 2014-07-06 LAB — COMPREHENSIVE METABOLIC PANEL
ALBUMIN: 4.3 g/dL (ref 3.5–5.2)
ALT: 30 U/L (ref 0–53)
AST: 22 U/L (ref 0–37)
Alkaline Phosphatase: 84 U/L (ref 39–117)
BUN: 19 mg/dL (ref 6–23)
CHLORIDE: 103 meq/L (ref 96–112)
CO2: 26 mEq/L (ref 19–32)
CREATININE: 0.93 mg/dL (ref 0.50–1.35)
Calcium: 9.6 mg/dL (ref 8.4–10.5)
GLUCOSE: 97 mg/dL (ref 70–99)
Potassium: 3.6 mEq/L (ref 3.5–5.3)
Sodium: 141 mEq/L (ref 135–145)
Total Bilirubin: 1 mg/dL (ref 0.2–1.2)
Total Protein: 7.2 g/dL (ref 6.0–8.3)

## 2014-07-06 LAB — TSH: TSH: 1.271 u[IU]/mL (ref 0.350–4.500)

## 2014-07-06 NOTE — Progress Notes (Signed)
   Subjective:    Patient ID: Neil Rivera, male    DOB: 07-02-1945, 69 y.o.   MRN: [id number]  HPI He is here for medication check. He does have hypertension and continues on his diaphoretic. He also has a family history of heart disease with his father having heart attack at age 30. In the past he has seen cardiology and did have a stress test which was negative. He continues on Lipitor for his cholesterol. There is also family history of diabetes. He does take Synthroid for his low thyroid. He does complain of some reflux symptoms especially when he eats very quickly.Review his record indicates he does need a TDaP. His last colonoscopy was 2012. He does continue to work and is enjoying this. He has remarried. He admits to his physical activities being quite limited. He mainly walks.There has been some family stress with his wife and his children from previous marriage. They're working on this with counseling. Family and social history was otherwise reviewed as well as immunizations and health maintenance. He has no other concerns or complaints.   Review of Systems  All other systems reviewed and are negative.      Objective:   Physical Exam Alert and in no distress. Tympanic membranes and canals are normal. Pharyngeal area is normal. Neck is supple without adenopathy or thyromegaly. Cardiac exam shows a regular sinus rhythm without murmurs or gallops. Lungs are clear to auscultation.Abdominal exam shows no hepatosplenomegaly, masses or tenderness.        Assessment & Plan:  Essential hypertension - Plan: CBC with Differential/Platelet, Comprehensive metabolic panel  Family history of heart disease - Plan: CBC with Differential/Platelet, Comprehensive metabolic panel, Lipid panel  Other specified hypothyroidism - Plan: TSH  Family history of diabetes mellitus  Hyperlipidemia with target LDL less than 100 - Plan: Lipid panel Discussed increased physical activity 250 minutes per week.  Also discussed cutting back on carbohydrates.Recommend he spent more time with eating as well as trying accident or Zantac if he has reflux type symptoms.

## 2014-07-06 NOTE — Patient Instructions (Addendum)
Cut back on "white food" Try Zantac or Axid if you have true reflux 150 minutes a week of something physical

## 2014-07-07 MED ORDER — LEVOTHYROXINE SODIUM 100 MCG PO TABS: ORAL_TABLET | ORAL | Status: DC

## 2014-07-07 MED ORDER — HYDROCHLOROTHIAZIDE 25 MG PO TABS: 12.5000 mg | ORAL_TABLET | Freq: Every day | ORAL | Status: DC

## 2014-07-07 MED ORDER — ATORVASTATIN CALCIUM 20 MG PO TABS: ORAL_TABLET | ORAL | Status: DC

## 2014-07-07 NOTE — Addendum Note (Signed)
Addended by: Denita Lung on: 07/07/2014 08:35 AM   Modules accepted: Orders

## 2014-07-12 DIAGNOSIS — F419 Anxiety disorder, unspecified: Secondary | ICD-10-CM | POA: Diagnosis not present

## 2014-07-19 DIAGNOSIS — F419 Anxiety disorder, unspecified: Secondary | ICD-10-CM | POA: Diagnosis not present

## 2014-07-26 DIAGNOSIS — F419 Anxiety disorder, unspecified: Secondary | ICD-10-CM | POA: Diagnosis not present

## 2014-08-18 DIAGNOSIS — F419 Anxiety disorder, unspecified: Secondary | ICD-10-CM | POA: Diagnosis not present

## 2014-08-30 DIAGNOSIS — F419 Anxiety disorder, unspecified: Secondary | ICD-10-CM | POA: Diagnosis not present

## 2014-09-14 DIAGNOSIS — F419 Anxiety disorder, unspecified: Secondary | ICD-10-CM | POA: Diagnosis not present

## 2014-09-27 ENCOUNTER — Telehealth: Payer: Self-pay

## 2014-09-27 NOTE — Telephone Encounter (Signed)
Records sent to [facility] on 09/27/2014

## 2014-09-28 DIAGNOSIS — F419 Anxiety disorder, unspecified: Secondary | ICD-10-CM | POA: Diagnosis not present

## 2014-10-12 DIAGNOSIS — F419 Anxiety disorder, unspecified: Secondary | ICD-10-CM | POA: Diagnosis not present

## 2014-10-25 DIAGNOSIS — F419 Anxiety disorder, unspecified: Secondary | ICD-10-CM | POA: Diagnosis not present

## 2014-11-09 DIAGNOSIS — F419 Anxiety disorder, unspecified: Secondary | ICD-10-CM | POA: Diagnosis not present

## 2014-11-23 DIAGNOSIS — F419 Anxiety disorder, unspecified: Secondary | ICD-10-CM | POA: Diagnosis not present

## 2014-12-07 DIAGNOSIS — F419 Anxiety disorder, unspecified: Secondary | ICD-10-CM | POA: Diagnosis not present

## 2014-12-08 DIAGNOSIS — M25561 Pain in right knee: Secondary | ICD-10-CM | POA: Diagnosis not present

## 2014-12-08 DIAGNOSIS — M25572 Pain in left ankle and joints of left foot: Secondary | ICD-10-CM | POA: Diagnosis not present

## 2014-12-28 DIAGNOSIS — F419 Anxiety disorder, unspecified: Secondary | ICD-10-CM | POA: Diagnosis not present

## 2015-01-03 DIAGNOSIS — F419 Anxiety disorder, unspecified: Secondary | ICD-10-CM | POA: Diagnosis not present

## 2015-01-10 DIAGNOSIS — F419 Anxiety disorder, unspecified: Secondary | ICD-10-CM | POA: Diagnosis not present

## 2015-01-12 ENCOUNTER — Other Ambulatory Visit: Payer: Self-pay | Admitting: Gastroenterology

## 2015-01-12 DIAGNOSIS — R1013 Epigastric pain: Secondary | ICD-10-CM

## 2015-01-12 DIAGNOSIS — Z86010 Personal history of colonic polyps: Secondary | ICD-10-CM | POA: Diagnosis not present

## 2015-01-24 DIAGNOSIS — F419 Anxiety disorder, unspecified: Secondary | ICD-10-CM | POA: Diagnosis not present

## 2015-01-25 DIAGNOSIS — D3131 Benign neoplasm of right choroid: Secondary | ICD-10-CM | POA: Diagnosis not present

## 2015-01-31 ENCOUNTER — Ambulatory Visit
Admission: RE | Admit: 2015-01-31 | Discharge: 2015-01-31 | Disposition: A | Discharge status: Home / Self Care | Payer: Medicare Other | Source: Ambulatory Visit | Attending: Gastroenterology | Admitting: Gastroenterology

## 2015-01-31 DIAGNOSIS — K219 Gastro-esophageal reflux disease without esophagitis: Secondary | ICD-10-CM | POA: Diagnosis not present

## 2015-01-31 DIAGNOSIS — R1013 Epigastric pain: Secondary | ICD-10-CM | POA: Diagnosis not present

## 2015-02-02 ENCOUNTER — Ambulatory Visit (INDEPENDENT_AMBULATORY_CARE_PROVIDER_SITE_OTHER): Payer: Medicare Other | Admitting: Urgent Care

## 2015-02-02 ENCOUNTER — Ambulatory Visit (INDEPENDENT_AMBULATORY_CARE_PROVIDER_SITE_OTHER): Payer: Medicare Other

## 2015-02-02 VITALS — BP 142/108 | HR 91 | Temp 97.8°F | Resp 16 | Ht 69.0 in | Wt 170.0 lb

## 2015-02-02 DIAGNOSIS — I10 Essential (primary) hypertension: Secondary | ICD-10-CM | POA: Diagnosis not present

## 2015-02-02 DIAGNOSIS — R030 Elevated blood-pressure reading, without diagnosis of hypertension: Secondary | ICD-10-CM

## 2015-02-02 DIAGNOSIS — M25431 Effusion, right wrist: Secondary | ICD-10-CM | POA: Diagnosis not present

## 2015-02-02 DIAGNOSIS — M25531 Pain in right wrist: Secondary | ICD-10-CM

## 2015-02-02 DIAGNOSIS — S6991XA Unspecified injury of right wrist, hand and finger(s), initial encounter: Secondary | ICD-10-CM

## 2015-02-02 NOTE — Progress Notes (Signed)
    MRN: [id number] DOB: 1945/06/19  Subjective:   Neil Rivera is a 70 y.o. male presenting for chief complaint of Hand Pain and Flu Vaccine  Reports 1 day history of right hand injury. Neil Rivera fell last night and broke his fall with his right hand, Neil Rivera subsequently struck his hand on the floor out of frustration. Admits that Neil Rivera has had increasing swelling and pain over lateral side of right hand and wrist. Has tried 2 Alleve with minimal relief. 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  Neil Rivera has history of HTN. Neil Rivera did not take his BP medication this morning. States that his BP is well controlled. Denies chest pain, shob, n/v, abdominal pain, hematuria, lower leg swelling.  Neil Rivera has a current medication list which includes the following prescription(s): aspirin, atorvastatin, hydrochlorothiazide, levothyroxine, and multivitamin with minerals. Also has No Known Allergies.  Neil Rivera  has a past medical history of Hypercholesterolemia; Allergy; Hypertension; Thyroid disease; and History of colonic polyps. Also  has past surgical history that includes Elbow surgery and Finger surgery.  Objective:   Vitals: BP 142/108 mmHg  Pulse 91  Temp(Src) 97.8 F (36.6 C) (Oral)  Resp 16  Ht 5\' 9"  (1.753 m)  Wt 170 lb (77.111 kg)  BMI 25.09 kg/m2  SpO2 99%  BP Readings from Last 3 Encounters:  02/02/15 142/108  07/06/14 124/80  06/28/14 130/90   Physical Exam  Constitutional: Neil Rivera is oriented to person, place, and time. Neil Rivera appears well-developed and well-nourished.  Eyes: No scleral icterus.  Cardiovascular: Normal rate, regular rhythm and intact distal pulses.  Exam reveals no gallop and no friction rub.   No murmur heard. Pulmonary/Chest: Effort normal. No respiratory distress. Neil Rivera has no wheezes. Neil Rivera has no rales.  Musculoskeletal: Neil Rivera exhibits no edema.       Right hand: Neil Rivera exhibits decreased range of motion (mild decrease in flexion, extension, ulnar and radial flexion), tenderness  (over area outlined) and swelling (over area outlined). Neil Rivera exhibits normal capillary refill, no deformity and no laceration. Normal sensation noted. Normal strength noted.       Hands: Neurological: Neil Rivera is alert and oriented to person, place, and time.  Skin: Skin is warm and dry. No rash noted. No erythema. No pallor.  Neil Rivera has ecchymosis over ulnar aspect of right hand.   [facility] reading (PRIMARY) by  Dr. Carlota Rivera and PA-Neil Rivera. Right hand - soft tissue swelling over ulnar aspect of wrist, possible irregularity of pisiform versus triquetrum, please comment.  Assessment and Plan :   1. Hand injury, right, initial encounter 2. Right wrist pain 3. Wrist swelling, right - Possible wrist fracture, radiology over-read pending. Neil Rivera placed in ulnar gutter splint, f/u after x-ray over-read. Consider referral to hand specialist.  4. Essential hypertension 5. Elevated blood pressure reading - Suspect that Neil Rivera's BP is elevated due to missed dose and wrist pain. Counseled on need for BP management if his BP remains elevated. Neil Rivera agreed.  Neil Eagles, PA-C [facility] [phone] 02/02/2015 8:26 AM

## 2015-02-02 NOTE — Patient Instructions (Signed)
Wrist Pain There are many things that can cause wrist pain. Some common causes include:  An injury to the wrist area, such as a sprain, strain, or fracture.  Overuse of the joint.  A condition that causes increased pressure on a nerve in the wrist (carpal tunnel syndrome).  Wear and tear of the joints that occurs with aging (osteoarthritis).  A variety of other types of arthritis. Sometimes, the cause of wrist pain is not known. The pain often goes away when you follow your health care provider's instructions for relieving pain at home. If your wrist pain continues, tests may need to be done to diagnose your condition. HOME CARE INSTRUCTIONS Pay attention to any changes in your symptoms. Take these actions to help with your pain:  Rest the wrist area for at least 48 hours or as told by your health care provider.  If directed, apply ice to the injured area:  Put ice in a plastic bag.  Place a towel between your skin and the bag.  Leave the ice on for 20 minutes, 2-3 times per day.  Keep your arm raised (elevated) above the level of your heart while you are sitting or lying down.  If a splint or elastic bandage has been applied, use it as told by your health care provider.  Remove the splint or bandage only as told by your health care provider.  Loosen the splint or bandage if your fingers become numb or have a tingling feeling, or if they turn cold or blue.  Take over-the-counter and prescription medicines only as told by your health care provider.  Keep all follow-up visits as told by your health care provider. This is important. SEEK MEDICAL CARE IF:  Your pain is not helped by treatment.  Your pain gets worse. SEEK IMMEDIATE MEDICAL CARE IF:  Your fingers become swollen.  Your fingers turn white, very red, or cold and blue.  Your fingers are numb or have a tingling feeling.  You have difficulty moving your fingers.   This information is not intended to replace  advice given to you by your health care provider. Make sure you discuss any questions you have with your health care provider.   Document Released: 10/17/2004 Document Revised: 09/28/2014 Document Reviewed: 05/25/2014 Elsevier Interactive Patient Education 2016 [employer]  

## 2015-02-05 ENCOUNTER — Ambulatory Visit (INDEPENDENT_AMBULATORY_CARE_PROVIDER_SITE_OTHER): Payer: Medicare Other | Admitting: Physician Assistant

## 2015-02-05 VITALS — BP 140/82 | HR 70 | Temp 97.9°F | Resp 18 | Ht 68.75 in | Wt 171.4 lb

## 2015-02-05 DIAGNOSIS — M25531 Pain in right wrist: Secondary | ICD-10-CM

## 2015-02-05 NOTE — Patient Instructions (Signed)
You may remove the splint for bathing and when you are relaxing, and reapply it when you get back to activity.

## 2015-02-05 NOTE — Progress Notes (Signed)
Chief Complaint  Patient presents with  . Follow-up    Right hand injurry, swelling    History of Present Illness: Patient presents for re-evaluation of RIGHT hand/wrist injury, having noted some swelling has developed.  He was seen here on 02/02/15 after a fall, having caught himself on his outstretched RIGHT hand. He is RIGHT hand dominant. Xrays revealed no fracture, and he was placed in an ulnar gutter splint for support and comfort, with instructions to return in 1 week.  He finds the splint to be uncomfortable, feels too tight, and his wife noticed that the fingers and thumb appeared swollen.   No Known Allergies  Prior to Admission medications   Medication Sig Start Date End Date Taking? Authorizing Provider  aspirin 81 MG tablet Take 81 mg by mouth daily.     Yes Historical Provider, MD  atorvastatin (LIPITOR) 20 MG tablet TAKE 1 TABLET (20 MG TOTAL) BY MOUTH DAILY. 07/07/14  Yes Denita Lung, MD  hydrochlorothiazide (HYDRODIURIL) 25 MG tablet Take 0.5 tablets (12.5 mg total) by mouth daily. 07/07/14  Yes Denita Lung, MD  levothyroxine (SYNTHROID, LEVOTHROID) 100 MCG tablet TAKE 1 TABLET (100 MCG TOTAL) BY MOUTH DAILY. 07/07/14  Yes Denita Lung, MD  Multiple Vitamins-Minerals (MULTIVITAMIN WITH MINERALS) tablet Take 1 tablet by mouth daily.   Yes Historical Provider, MD  pantoprazole (PROTONIX) 40 MG tablet  01/12/15   Historical Provider, MD    Patient Active Problem List   Diagnosis Date Noted  . Other specified hypothyroidism 07/06/2014  . Hyperlipidemia with target LDL less than 100 06/29/2013  . Bradycardia 11/08/2010  . Hypertension 10/05/2010  . Hypothyroid 10/05/2010  . Family history of heart disease in male family member before age 67 10/05/2010  . Family history of diabetes mellitus 10/05/2010     Physical Exam  Constitutional: He is oriented to person, place, and time. He appears well-developed and well-nourished. He is active and cooperative. No distress.   BP 140/82 mmHg  Pulse 70  Temp(Src) 97.9 F (36.6 C) (Oral)  Resp 18  Ht 5' 8.75" (1.746 m)  Wt 171 lb 6.4 oz (77.747 kg)  BMI 25.50 kg/m2  SpO2 96%   Eyes: Conjunctivae are normal.  Pulmonary/Chest: Effort normal.  Musculoskeletal:       Right wrist: He exhibits decreased range of motion and tenderness. He exhibits no bony tenderness and no swelling.       Right hand: He exhibits decreased range of motion, tenderness, bony tenderness and swelling. He exhibits normal two-point discrimination, normal capillary refill, no deformity and no laceration. Normal sensation noted. Normal strength noted.       Hands: Mild swelling noted of the thumb and fingers distal to the splint. Splint removed. Resolving ecchymosis of the ulnar aspect of the hand and wrist. ROM is limited not by pain, but "stiffness." Tenderness is mild.  The hand and wrist were then washed with soap and water, dried thoroughly, and the splint reapplied more loosely.  Neurological: He is alert and oriented to person, place, and time.  Psychiatric: He has a normal mood and affect. His speech is normal and behavior is normal.      ASSESSMENT & PLAN:  1. Right wrist pain AS there is no fracture, and his pain is considerably improved, I recommend removal of the splint for bathing and when he is not active. Return as planned at the end of this week for re-evaluation and repeat xrays if still having tenderness.   Katerine Morua  Janalee Dane, PA-C Physician Assistant-Certified [facility]

## 2015-02-08 DIAGNOSIS — F419 Anxiety disorder, unspecified: Secondary | ICD-10-CM | POA: Diagnosis not present

## 2015-02-15 DIAGNOSIS — M25531 Pain in right wrist: Secondary | ICD-10-CM | POA: Diagnosis not present

## 2015-02-22 DIAGNOSIS — F419 Anxiety disorder, unspecified: Secondary | ICD-10-CM | POA: Diagnosis not present

## 2015-03-06 ENCOUNTER — Telehealth: Payer: Self-pay | Admitting: Family Medicine

## 2015-03-06 NOTE — Telephone Encounter (Signed)
Let him know that he does not need another shot. Shingles is not contagious except to somebody who has never had chickenpox

## 2015-03-06 NOTE — Telephone Encounter (Signed)
Pt wants to know if he needs another shingles shot since someone in his office has shingles now. Call him back at [phone]

## 2015-03-06 NOTE — Telephone Encounter (Signed)
Left VM he does not need another shot. Shingles is not contagious except to somebody who has never had chickenpox

## 2015-03-15 ENCOUNTER — Other Ambulatory Visit: Payer: Self-pay | Admitting: Family Medicine

## 2015-03-24 DIAGNOSIS — L728 Other follicular cysts of the skin and subcutaneous tissue: Secondary | ICD-10-CM | POA: Diagnosis not present

## 2015-06-05 DIAGNOSIS — M7711 Lateral epicondylitis, right elbow: Secondary | ICD-10-CM | POA: Diagnosis not present

## 2015-06-05 DIAGNOSIS — R2231 Localized swelling, mass and lump, right upper limb: Secondary | ICD-10-CM | POA: Diagnosis not present

## 2015-06-12 DIAGNOSIS — M25631 Stiffness of right wrist, not elsewhere classified: Secondary | ICD-10-CM | POA: Diagnosis not present

## 2015-06-12 DIAGNOSIS — M79601 Pain in right arm: Secondary | ICD-10-CM | POA: Diagnosis not present

## 2015-06-12 DIAGNOSIS — M7711 Lateral epicondylitis, right elbow: Secondary | ICD-10-CM | POA: Diagnosis not present

## 2015-07-13 ENCOUNTER — Encounter: Payer: Self-pay | Admitting: Family Medicine

## 2015-07-13 ENCOUNTER — Ambulatory Visit (INDEPENDENT_AMBULATORY_CARE_PROVIDER_SITE_OTHER): Payer: Medicare Other | Admitting: Family Medicine

## 2015-07-13 VITALS — BP 130/88 | HR 66 | Ht 68.5 in | Wt 168.4 lb

## 2015-07-13 DIAGNOSIS — Z1159 Encounter for screening for other viral diseases: Secondary | ICD-10-CM

## 2015-07-13 DIAGNOSIS — E039 Hypothyroidism, unspecified: Secondary | ICD-10-CM

## 2015-07-13 DIAGNOSIS — I10 Essential (primary) hypertension: Secondary | ICD-10-CM | POA: Diagnosis not present

## 2015-07-13 DIAGNOSIS — Z86010 Personal history of colonic polyps: Secondary | ICD-10-CM | POA: Diagnosis not present

## 2015-07-13 DIAGNOSIS — E785 Hyperlipidemia, unspecified: Secondary | ICD-10-CM | POA: Diagnosis not present

## 2015-07-13 DIAGNOSIS — K219 Gastro-esophageal reflux disease without esophagitis: Secondary | ICD-10-CM

## 2015-07-13 DIAGNOSIS — Z833 Family history of diabetes mellitus: Secondary | ICD-10-CM

## 2015-07-13 DIAGNOSIS — Z8249 Family history of ischemic heart disease and other diseases of the circulatory system: Secondary | ICD-10-CM

## 2015-07-13 DIAGNOSIS — M7711 Lateral epicondylitis, right elbow: Secondary | ICD-10-CM | POA: Diagnosis not present

## 2015-07-13 LAB — COMPREHENSIVE METABOLIC PANEL
ALBUMIN: 4.1 g/dL (ref 3.6–5.1)
ALK PHOS: 79 U/L (ref 40–115)
ALT: 28 U/L (ref 9–46)
AST: 20 U/L (ref 10–35)
BUN: 17 mg/dL (ref 7–25)
CHLORIDE: 103 mmol/L (ref 98–110)
CO2: 25 mmol/L (ref 20–31)
Calcium: 9 mg/dL (ref 8.6–10.3)
Creat: 0.94 mg/dL (ref 0.70–1.25)
Glucose, Bld: 100 mg/dL — ABNORMAL HIGH (ref 65–99)
Potassium: 3.4 mmol/L — ABNORMAL LOW (ref 3.5–5.3)
Sodium: 142 mmol/L (ref 135–146)
TOTAL PROTEIN: 6.6 g/dL (ref 6.1–8.1)
Total Bilirubin: 1 mg/dL (ref 0.2–1.2)

## 2015-07-13 LAB — POCT URINALYSIS DIPSTICK
Bilirubin, UA: NEGATIVE
Blood, UA: NEGATIVE
Glucose, UA: NEGATIVE
KETONES UA: NEGATIVE
Leukocytes, UA: NEGATIVE
Nitrite, UA: NEGATIVE
PH UA: 6.5
Protein, UA: NEGATIVE
Spec Grav, UA: 1.025
Urobilinogen, UA: NEGATIVE

## 2015-07-13 LAB — CBC WITH DIFFERENTIAL/PLATELET
BASOS ABS: 66 {cells}/uL (ref 0–200)
BASOS PCT: 1 %
EOS ABS: 198 {cells}/uL (ref 15–500)
Eosinophils Relative: 3 %
HCT: 46.9 % (ref 38.5–50.0)
HEMOGLOBIN: 16.2 g/dL (ref 13.2–17.1)
Lymphocytes Relative: 13 %
Lymphs Abs: 858 cells/uL (ref 850–3900)
MCH: 33.1 pg — AB (ref 27.0–33.0)
MCHC: 34.5 g/dL (ref 32.0–36.0)
MCV: 95.9 fL (ref 80.0–100.0)
MONO ABS: 726 {cells}/uL (ref 200–950)
MPV: 11.9 fL (ref 7.5–12.5)
Monocytes Relative: 11 %
NEUTROS ABS: 4752 {cells}/uL (ref 1500–7800)
Neutrophils Relative %: 72 %
Platelets: 163 10*3/uL (ref 140–400)
RBC: 4.89 MIL/uL (ref 4.20–5.80)
RDW: 13.5 % (ref 11.0–15.0)
WBC: 6.6 10*3/uL (ref 4.0–10.5)

## 2015-07-13 LAB — LIPID PANEL
CHOL/HDL RATIO: 2.4 ratio (ref <=5.0)
CHOLESTEROL: 131 mg/dL (ref 125–200)
HDL: 54 mg/dL (ref >=40)
LDL Cholesterol: 63 mg/dL (ref <130)
Triglycerides: 68 mg/dL (ref <150)
VLDL: 14 mg/dL (ref <30)

## 2015-07-13 LAB — TSH: TSH: 0.8 m[IU]/L (ref 0.40–4.50)

## 2015-07-13 MED ORDER — HYDROCHLOROTHIAZIDE 25 MG PO TABS: 12.5000 mg | ORAL_TABLET | Freq: Every day | ORAL | Status: DC

## 2015-07-13 MED ORDER — PANTOPRAZOLE SODIUM 40 MG PO TBEC: 40.0000 mg | DELAYED_RELEASE_TABLET | Freq: Every day | ORAL | Status: DC

## 2015-07-13 MED ORDER — ATORVASTATIN CALCIUM 20 MG PO TABS: ORAL_TABLET | ORAL | Status: DC

## 2015-07-13 MED ORDER — LEVOTHYROXINE SODIUM 100 MCG PO TABS: ORAL_TABLET | ORAL | Status: DC

## 2015-07-13 NOTE — Patient Instructions (Signed)
Learn from the past, prepare for the future and live in the present I want your bucket list to be empty

## 2015-07-13 NOTE — Progress Notes (Signed)
Subjective:   HPI  Neil Rivera is a 70 y.o. male who presents for a complete physical.  Medical care team includes:  Dr.Buccini [facility] He is to have follow-up colonoscopy this year.   Preventative care: Last ophthalmology visit: last 6 months Last dental visit:goes every 3 months Last colonoscopy:03/05/2010 Last prostate exam: ? Last EKG:10/15/10 Last labs:07/06/2014  Prior vaccinations: TD or Tdap: 2005 Influenza: Pneumococcal:23/4/17/2009 13/4/17/2009 Shingles/Zostavax:05/08/2007 Other: -  Advanced directive: Information given  Concerns: He has had difficulty recently with right elbow and has seen orthopedics. He was diagnosed with lateral epicondylitis. He also occasionally has some left buttock pain that usually goes away fairly quickly with the use of an anti-inflammatory. He continues on his thyroid medicine and is having no difficulty with skin or hair changes. He does use HCTZ for his blood pressure and again having no problems with that. He does have a history of reflux disease and is taking Protonix on a regular basis.He also has history of colonic polyps and is scheduled for 5 year follow-up soon. He continues on Lipitor without muscle aches or pains. He does exercise fairly regularly. His home life is going well.   Reviewed their medical, surgical, family, social, medication, and allergy history and updated chart as appropriate. Father did have an MI at age 75.   Review of Systems Constitutional: -fever, -chills, -sweats, -unexpected weight change, -decreased appetite, -fatigue Allergy: -sneezing, -itching, -congestion Dermatology: -changing moles, --rash, -lumps ENT: -runny nose, -ear pain, -sore throat, -hoarseness, -sinus pain, -teeth pain, - ringing in ears, -hearing loss, -nosebleeds Cardiology: -chest pain, -palpitations, -swelling, -difficulty breathing when lying flat, -waking up short of breath Respiratory: -cough, -shortness of breath, -difficulty breathing  with exercise or exertion, -wheezing, -coughing up blood Gastroenterology: -abdominal pain, -nausea, -vomiting, -diarrhea, -constipation, -blood in stool, -changes in bowel movement, -difficulty swallowing or eating Hematology: -bleeding, -bruising  Musculoskeletal: -joint aches, -muscle aches, -joint swelling, -back pain, -neck pain, -cramping, -changes in gait Ophthalmology: denies vision changes, eye redness, itching, discharge Urology: -burning with urination, -difficulty urinating, -blood in urine, -urinary frequency, -urgency, -incontinence Neurology: -headache, -weakness, -tingling, -numbness, -memory loss, -falls, -dizziness Psychology: -depressed mood, -agitation, -sleep problems     Objective:   Physical Exam    General appearance: alert, no distress, WD/WN,  Skin:normal HEENT: normocephalic, conjunctiva/corneas normal, sclerae anicteric, PERRLA, EOMi, nares patent, no discharge or erythema, pharynx normal Oral cavity: MMM, tongue normal, teeth normal Neck: supple, no lymphadenopathy, no thyromegaly, no masses, normal ROM Chest: non tender, normal shape and expansion Heart: RRR, normal S1, S2, no murmurs Lungs: CTA bilaterally, no wheezes, rhonchi, or rales Abdomen: +bs, soft, non tender, non distended, no masses, no hepatomegaly, no splenomegaly, no bruits Back: non tender, normal ROM, no scoliosis Musculoskeletal: upper extremities non tender, no obvious deformity, normal ROM throughout, lower extremities non tender, no obvious deformity, normal ROM throughout area and points to the area of the sciatic notch is to wear his having discomfort. Extremities: no edema, no cyanosis, no clubbing. To palpation over the lateral epicondyle. Pulses: 2+ symmetric, upper and lower extremities, normal cap refill Neurological: alert, oriented x 3, CN2-12 intact, strength normal upper extremities and lower extremities, sensation normal throughout, DTRs 2+ throughout, no cerebellar signs, gait  normal Psychiatric: normal affect, behavior normal, pleasant    Assessment and Plan :   Lateral epicondylitis (tennis elbow), right  Essential hypertension - Plan: POCT Urinalysis Dipstick, hydrochlorothiazide (HYDRODIURIL) 25 MG tablet, CBC with Differential/Platelet, Comprehensive metabolic panel  Need for hepatitis C screening  test - Plan: Hepatitis C antibody  Hypothyroidism, unspecified hypothyroidism type - Plan: levothyroxine (SYNTHROID, LEVOTHROID) 100 MCG tablet, TSH  Family history of heart disease in male family member before age 69  Family history of diabetes mellitus  Hyperlipidemia with target LDL less than 100 - Plan: atorvastatin (LIPITOR) 20 MG tablet, Lipid panel  Gastroesophageal reflux disease, esophagitis presence not specified - Plan: pantoprazole (PROTONIX) 40 MG tablet Discussed the elbow issue with him. Instructed him on proper posturing of his wrist and also doing his main things palms up as possible. He will do some light strengthening exercises for this. Discussed PSA testing with him and at this time we will hold off on it. Discusses risk of heart disease and since he is taking good care of himself follow-up on that is not needed. Prescription written to get a TDaP pharmacy. Recommend he cut back on his PPI on take it more or less on an as-needed basis. He will continue on his other medications.    Physical exam - discussed healthy lifestyle, diet, exercise, preventative care, vaccinations, and addressed their concerns.   Over 45 minutes, greater than 50% spent in counseling and coordination of care separate from the wellness visit. Follow-up in year

## 2015-07-14 LAB — HEPATITIS C ANTIBODY: HCV AB: NEGATIVE

## 2015-10-10 DIAGNOSIS — Z86010 Personal history of colonic polyps: Secondary | ICD-10-CM | POA: Diagnosis not present

## 2015-10-10 DIAGNOSIS — R1013 Epigastric pain: Secondary | ICD-10-CM | POA: Diagnosis not present

## 2015-10-19 DIAGNOSIS — K5730 Diverticulosis of large intestine without perforation or abscess without bleeding: Secondary | ICD-10-CM | POA: Diagnosis not present

## 2015-10-19 DIAGNOSIS — D126 Benign neoplasm of colon, unspecified: Secondary | ICD-10-CM

## 2015-10-19 DIAGNOSIS — D123 Benign neoplasm of transverse colon: Secondary | ICD-10-CM | POA: Diagnosis not present

## 2015-10-19 DIAGNOSIS — Z86010 Personal history of colonic polyps: Secondary | ICD-10-CM | POA: Diagnosis not present

## 2015-10-19 HISTORY — DX: Benign neoplasm of colon, unspecified: D12.6

## 2015-10-19 LAB — HM COLONOSCOPY

## 2015-10-24 DIAGNOSIS — D126 Benign neoplasm of colon, unspecified: Secondary | ICD-10-CM | POA: Diagnosis not present

## 2015-11-27 ENCOUNTER — Encounter: Payer: Self-pay | Admitting: Family Medicine

## 2015-12-05 ENCOUNTER — Encounter: Payer: Self-pay | Admitting: Family Medicine

## 2016-01-16 DIAGNOSIS — Z23 Encounter for immunization: Secondary | ICD-10-CM | POA: Diagnosis not present

## 2016-01-30 DIAGNOSIS — F419 Anxiety disorder, unspecified: Secondary | ICD-10-CM | POA: Diagnosis not present

## 2016-02-08 ENCOUNTER — Ambulatory Visit: Payer: Medicare Other | Admitting: Family Medicine

## 2016-02-15 DIAGNOSIS — F4322 Adjustment disorder with anxiety: Secondary | ICD-10-CM | POA: Diagnosis not present

## 2016-06-20 ENCOUNTER — Other Ambulatory Visit: Payer: Self-pay

## 2016-06-20 ENCOUNTER — Telehealth: Payer: Self-pay | Admitting: Family Medicine

## 2016-06-20 MED ORDER — LEVOTHYROXINE SODIUM 100 MCG PO TABS: ORAL_TABLET | ORAL | 0 refills | Status: DC

## 2016-06-20 NOTE — Telephone Encounter (Signed)
Okay to switch but have him come in in 2 months so we can do a TSH as a nurse visit.

## 2016-06-20 NOTE — Telephone Encounter (Signed)
Recv'd fax from [facility] stating stating pt was taking [employer] brand Levothyroxine 15mcg 90 days which they no longer have they now carry [employer] brand, is this ok to switch?

## 2016-06-20 NOTE — Telephone Encounter (Signed)
Patient is aware of change of med manufacture and he said he would call back today to make appointment

## 2016-08-01 DIAGNOSIS — B353 Tinea pedis: Secondary | ICD-10-CM | POA: Diagnosis not present

## 2016-08-01 DIAGNOSIS — L723 Sebaceous cyst: Secondary | ICD-10-CM | POA: Diagnosis not present

## 2016-08-01 DIAGNOSIS — D225 Melanocytic nevi of trunk: Secondary | ICD-10-CM | POA: Diagnosis not present

## 2016-08-01 DIAGNOSIS — L739 Follicular disorder, unspecified: Secondary | ICD-10-CM | POA: Diagnosis not present

## 2016-08-09 ENCOUNTER — Other Ambulatory Visit: Payer: Self-pay | Admitting: Family Medicine

## 2016-08-09 DIAGNOSIS — E785 Hyperlipidemia, unspecified: Secondary | ICD-10-CM

## 2016-08-12 NOTE — Telephone Encounter (Signed)
Pt has an appt on 7/25. Will refill for 30 days only

## 2016-08-13 DIAGNOSIS — H35373 Puckering of macula, bilateral: Secondary | ICD-10-CM | POA: Diagnosis not present

## 2016-08-14 ENCOUNTER — Ambulatory Visit (INDEPENDENT_AMBULATORY_CARE_PROVIDER_SITE_OTHER): Payer: Medicare Other | Admitting: Family Medicine

## 2016-08-14 ENCOUNTER — Encounter: Payer: Self-pay | Admitting: Family Medicine

## 2016-08-14 VITALS — BP 120/70 | HR 71 | Ht 68.5 in | Wt 172.0 lb

## 2016-08-14 DIAGNOSIS — E039 Hypothyroidism, unspecified: Secondary | ICD-10-CM | POA: Diagnosis not present

## 2016-08-14 DIAGNOSIS — Z833 Family history of diabetes mellitus: Secondary | ICD-10-CM

## 2016-08-14 DIAGNOSIS — Z86010 Personal history of colonic polyps: Secondary | ICD-10-CM | POA: Diagnosis not present

## 2016-08-14 DIAGNOSIS — E785 Hyperlipidemia, unspecified: Secondary | ICD-10-CM | POA: Diagnosis not present

## 2016-08-14 DIAGNOSIS — I10 Essential (primary) hypertension: Secondary | ICD-10-CM | POA: Diagnosis not present

## 2016-08-14 DIAGNOSIS — Z8612 Personal history of poliomyelitis: Secondary | ICD-10-CM | POA: Diagnosis not present

## 2016-08-14 DIAGNOSIS — Z8249 Family history of ischemic heart disease and other diseases of the circulatory system: Secondary | ICD-10-CM | POA: Diagnosis not present

## 2016-08-14 LAB — LIPID PANEL
CHOL/HDL RATIO: 2.8 ratio (ref <5.0)
Cholesterol: 147 mg/dL (ref <200)
HDL: 53 mg/dL (ref >40)
LDL CALC: 74 mg/dL (ref <100)
Triglycerides: 101 mg/dL (ref <150)
VLDL: 20 mg/dL (ref <30)

## 2016-08-14 LAB — COMPREHENSIVE METABOLIC PANEL
ALT: 24 U/L (ref 9–46)
AST: 18 U/L (ref 10–35)
Albumin: 4.2 g/dL (ref 3.6–5.1)
Alkaline Phosphatase: 81 U/L (ref 40–115)
BUN: 25 mg/dL (ref 7–25)
CALCIUM: 8.9 mg/dL (ref 8.6–10.3)
CO2: 22 mmol/L (ref 20–31)
Chloride: 102 mmol/L (ref 98–110)
Creat: 0.99 mg/dL (ref 0.70–1.18)
GLUCOSE: 95 mg/dL (ref 65–99)
POTASSIUM: 3.4 mmol/L — AB (ref 3.5–5.3)
Sodium: 141 mmol/L (ref 135–146)
Total Bilirubin: 1.1 mg/dL (ref 0.2–1.2)
Total Protein: 6.6 g/dL (ref 6.1–8.1)

## 2016-08-14 LAB — CBC WITH DIFFERENTIAL/PLATELET
BASOS ABS: 71 {cells}/uL (ref 0–200)
Basophils Relative: 1 %
Eosinophils Absolute: 355 cells/uL (ref 15–500)
Eosinophils Relative: 5 %
HCT: 47.4 % (ref 38.5–50.0)
Hemoglobin: 16.5 g/dL (ref 13.2–17.1)
Lymphocytes Relative: 20 %
Lymphs Abs: 1420 cells/uL (ref 850–3900)
MCH: 34 pg — ABNORMAL HIGH (ref 27.0–33.0)
MCHC: 34.8 g/dL (ref 32.0–36.0)
MCV: 97.5 fL (ref 80.0–100.0)
MPV: 11.7 fL (ref 7.5–12.5)
Monocytes Absolute: 710 cells/uL (ref 200–950)
Monocytes Relative: 10 %
NEUTROS PCT: 64 %
Neutro Abs: 4544 cells/uL (ref 1500–7800)
PLATELETS: 172 10*3/uL (ref 140–400)
RBC: 4.86 MIL/uL (ref 4.20–5.80)
RDW: 13 % (ref 11.0–15.0)
WBC: 7.1 10*3/uL (ref 4.0–10.5)

## 2016-08-14 MED ORDER — LEVOTHYROXINE SODIUM 100 MCG PO TABS: ORAL_TABLET | ORAL | 0 refills | Status: DC

## 2016-08-14 MED ORDER — ATORVASTATIN CALCIUM 20 MG PO TABS: 20.0000 mg | ORAL_TABLET | Freq: Every day | ORAL | 3 refills | Status: DC

## 2016-08-14 MED ORDER — HYDROCHLOROTHIAZIDE 25 MG PO TABS: 12.5000 mg | ORAL_TABLET | Freq: Every day | ORAL | 3 refills | Status: DC

## 2016-08-14 NOTE — Patient Instructions (Signed)
  Neil Rivera , Thank you for taking time to come for your Medicare Wellness Visit. I appreciate your ongoing commitment to your health goals. Please review the following plan we discussed and let me know if I can assist you in the future.   These are the goals we discussed: Goals    None      This is a list of the screening recommended for you and due dates:  Health Maintenance  Topic Date Due  . Tetanus Vaccine  05/24/1965  . Pneumonia vaccines (2 of 2 - PPSV23) 06/30/2014  . Flu Shot  08/21/2016  . Colon Cancer Screening  10/18/2020  .  Hepatitis C: One time screening is recommended by Center for Disease Control  (CDC) for  adults born from [birth year] through 1965.   Completed

## 2016-08-14 NOTE — Progress Notes (Signed)
Subjective:   HPI  Neil Rivera is a 71 y.o. male who presents for No chief complaint on file.   Medical care team includes: Denita Lung, MD here for primary care    Preventative care: [employer] Last ophthalmology visit: 08/13/16 Last dental visit: every 4 months Last colonoscopy:10/19/15 Last prostate exam: ? Last EKG:10/15/10 Last labs:07/13/15  Prior vaccinations: TD or Tdap: Influenza:Up-to-date Pneumococcal:23: 05/08/07 13:06/29/13 Shingles/Zostavax: 05/08/07   Advanced directive: No. Information given Concerns: He has no particular concerns or complaints. He is on Synthroid for treatment of hypothyroid and having no skin hair changes or cut or cold intolerance. He continues on his blood pressure medication and is having no troubles with that. He also takes Lipitor without aches or pains. There is a positive family history for heart disease. He also has a history of colonic polyps but has had a colonoscopy. He is no longer taking the PPI stating he is eating better and therefore having less indigestion symptoms. He then stated that he did have polio as a child. Apparently he did miss school during his early years around age 31 since then has had no residual and he is aware of. He continues to work and has no plans on quitting. His home life is going well.  Reviewed their medical, surgical, family, social, medication, and allergy history and updated chart as appropriate.  Past Medical History:  Diagnosis Date  . Allergy    RHINITIS  . History of colonic polyps   . Hypercholesterolemia   . Hypertension   . Thyroid disease    HYPOTHYROID  . Tubular adenoma of colon 10/19/2015    Past Surgical History:  Procedure Laterality Date  . ELBOW SURGERY    . FINGER SURGERY      Social History   Social History  . Marital status: Married    Spouse name: N/A  . Number of children: N/A  . Years of education: N/A   Occupational History  . CPA    Social History Main Topics   . Smoking status: Never Smoker  . Smokeless tobacco: Not on file  . Alcohol use Yes     Comment: rare  . Drug use: No  . Sexual activity: Yes   Other Topics Concern  . Not on file   Social History Narrative   Lives with his wife.    Family History  Problem Relation Age of Onset  . Heart disease Father   . Diabetes Brother   . Diabetes Maternal Uncle      Current Outpatient Prescriptions:  .  aspirin 81 MG tablet, Take 81 mg by mouth daily.  , Disp: , Rfl:  .  atorvastatin (LIPITOR) 20 MG tablet, TAKE ONE TABLET BY MOUTH DAILY, Disp: 30 tablet, Rfl: 0 .  hydrochlorothiazide (HYDRODIURIL) 25 MG tablet, Take 0.5 tablets (12.5 mg total) by mouth daily., Disp: 90 tablet, Rfl: 3 .  levothyroxine (SYNTHROID, LEVOTHROID) 100 MCG tablet, TAKE 1 TABLET (100 MCG TOTAL) BY MOUTH DAILY., Disp: 90 tablet, Rfl: 0 .  Multiple Vitamins-Minerals (MULTIVITAMIN WITH MINERALS) tablet, Take 1 tablet by mouth daily., Disp: , Rfl:  .  pantoprazole (PROTONIX) 40 MG tablet, Take 1 tablet (40 mg total) by mouth daily., Disp: 90 tablet, Rfl: 3  No Known Allergies     Review of Systems He has no other concerns or complaints.   Objective:   General appearance: alert, no distress, WD/WN, Caucasian male Skin: Normal HEENT: normocephalic, conjunctiva/corneas normal, sclerae anicteric, PERRLA, EOMi, nares patent,  no discharge or erythema, pharynx normal Oral cavity: MMM, tongue normal, teeth normal Neck: supple, no lymphadenopathy, no thyromegaly, no masses, normal ROM, no bruits Chest: non tender, normal shape and expansion Heart: RRR, normal S1, S2, no murmurs Lungs: CTA bilaterally, no wheezes, rhonchi, or rales Abdomen: +bs, soft, non tender, non distended, no masses, no hepatomegaly, no splenomegaly, no bruits Back: non tender, normal ROM, no scoliosis Musculoskeletal: upper extremities non tender, no obvious deformity, normal ROM throughout, lower extremities non tender, no obvious deformity,  normal ROM throughout Extremities: no edema, no cyanosis, no clubbing Pulses: 2+ symmetric, upper and lower extremities, normal cap refill Neurological: alert, oriented x 3, CN2-12 intact, strength normal upper extremities and lower extremities, sensation normal throughout, DTRs 2+ throughout, no cerebellar signs, gait normal Psychiatric: normal affect, behavior normal, pleasant    Assessment and Plan :    Family history of diabetes mellitus  Family history of heart disease in male family member before age 10  History of colonic polyps  Hyperlipidemia with target LDL less than 100 - Plan: atorvastatin (LIPITOR) 20 MG tablet  Essential hypertension - Plan: hydrochlorothiazide (HYDRODIURIL) 25 MG tablet  Hypothyroidism, unspecified type  History of poliomyelitis Interestingly never mentioned polio until now. Encouraged him to continue on his present medication regimen for the above diagnoses. Encouraged him to become more physically active as he has cut back on his physical activities.

## 2016-08-15 LAB — TSH: TSH: 1.11 m[IU]/L (ref 0.40–4.50)

## 2016-08-18 IMAGING — CR DG HAND COMPLETE 3+V*R*
3 series · 3 of 3 positions shown · non-contrast
Comparison: None.

CLINICAL DATA: Fall last night.  Pain fifth finger

EXAM:
RIGHT HAND - COMPLETE 3+ VIEW

[PA]
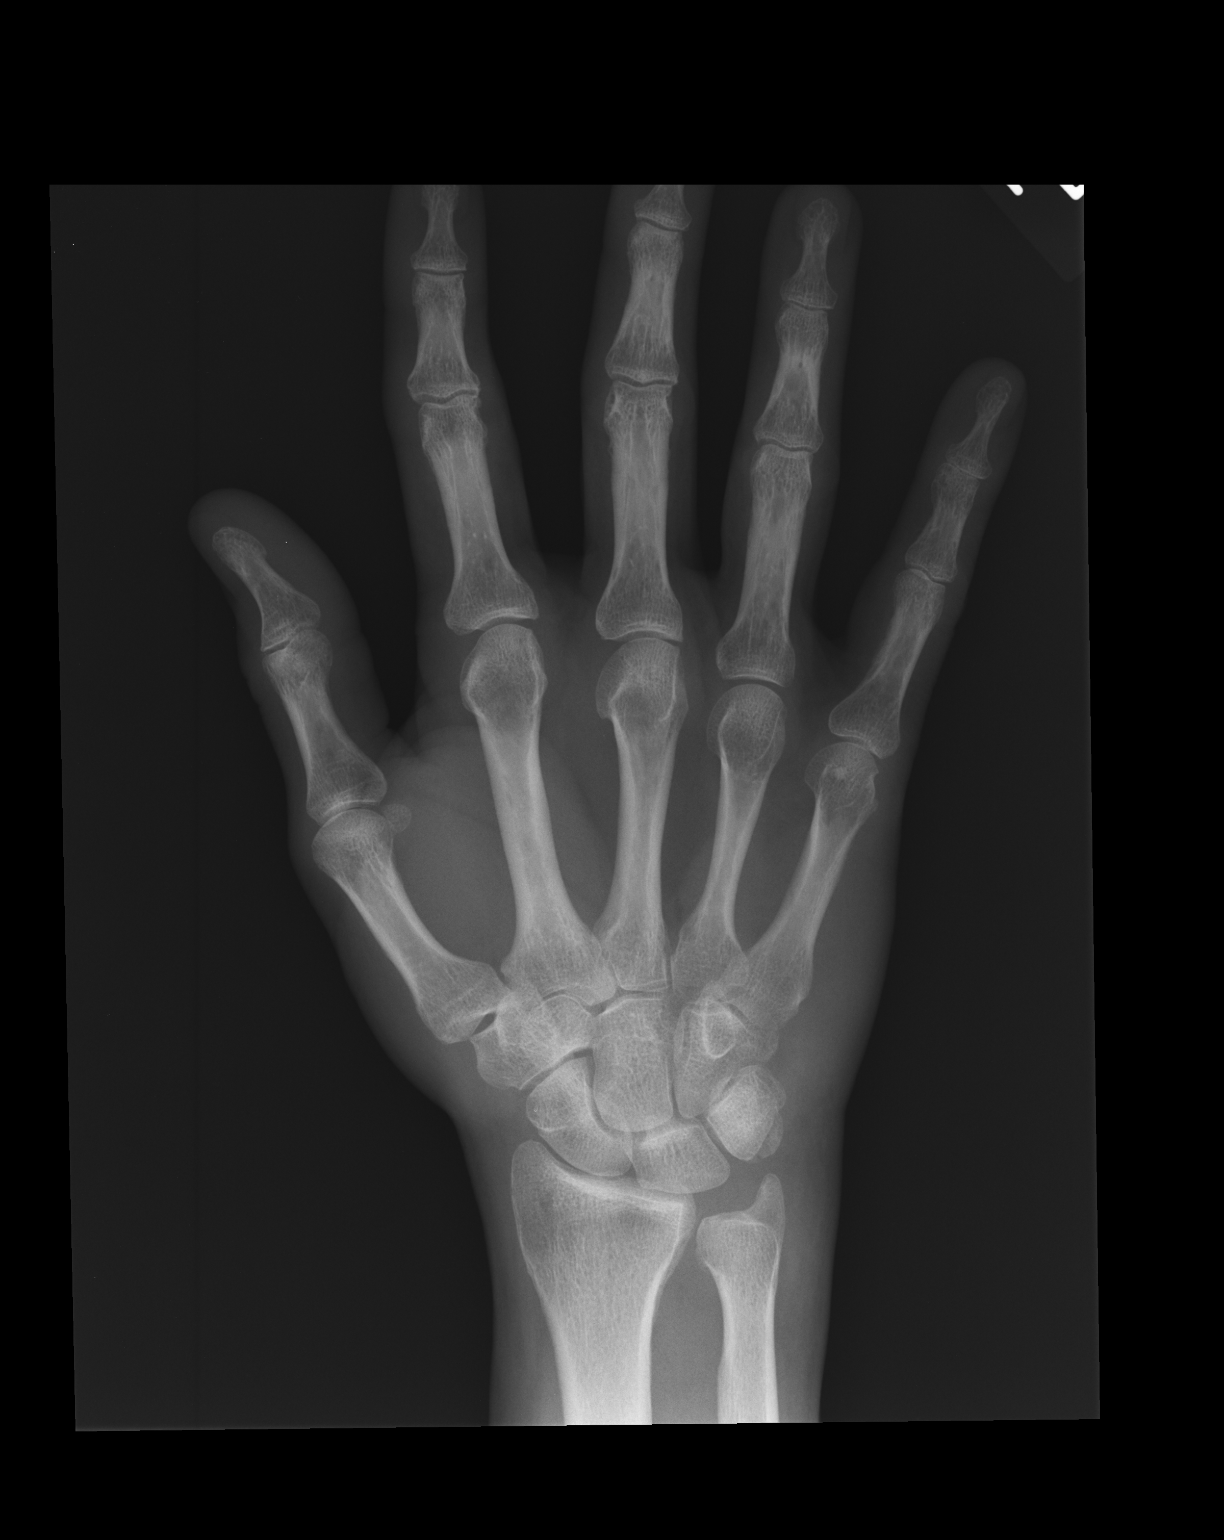

[lateral]
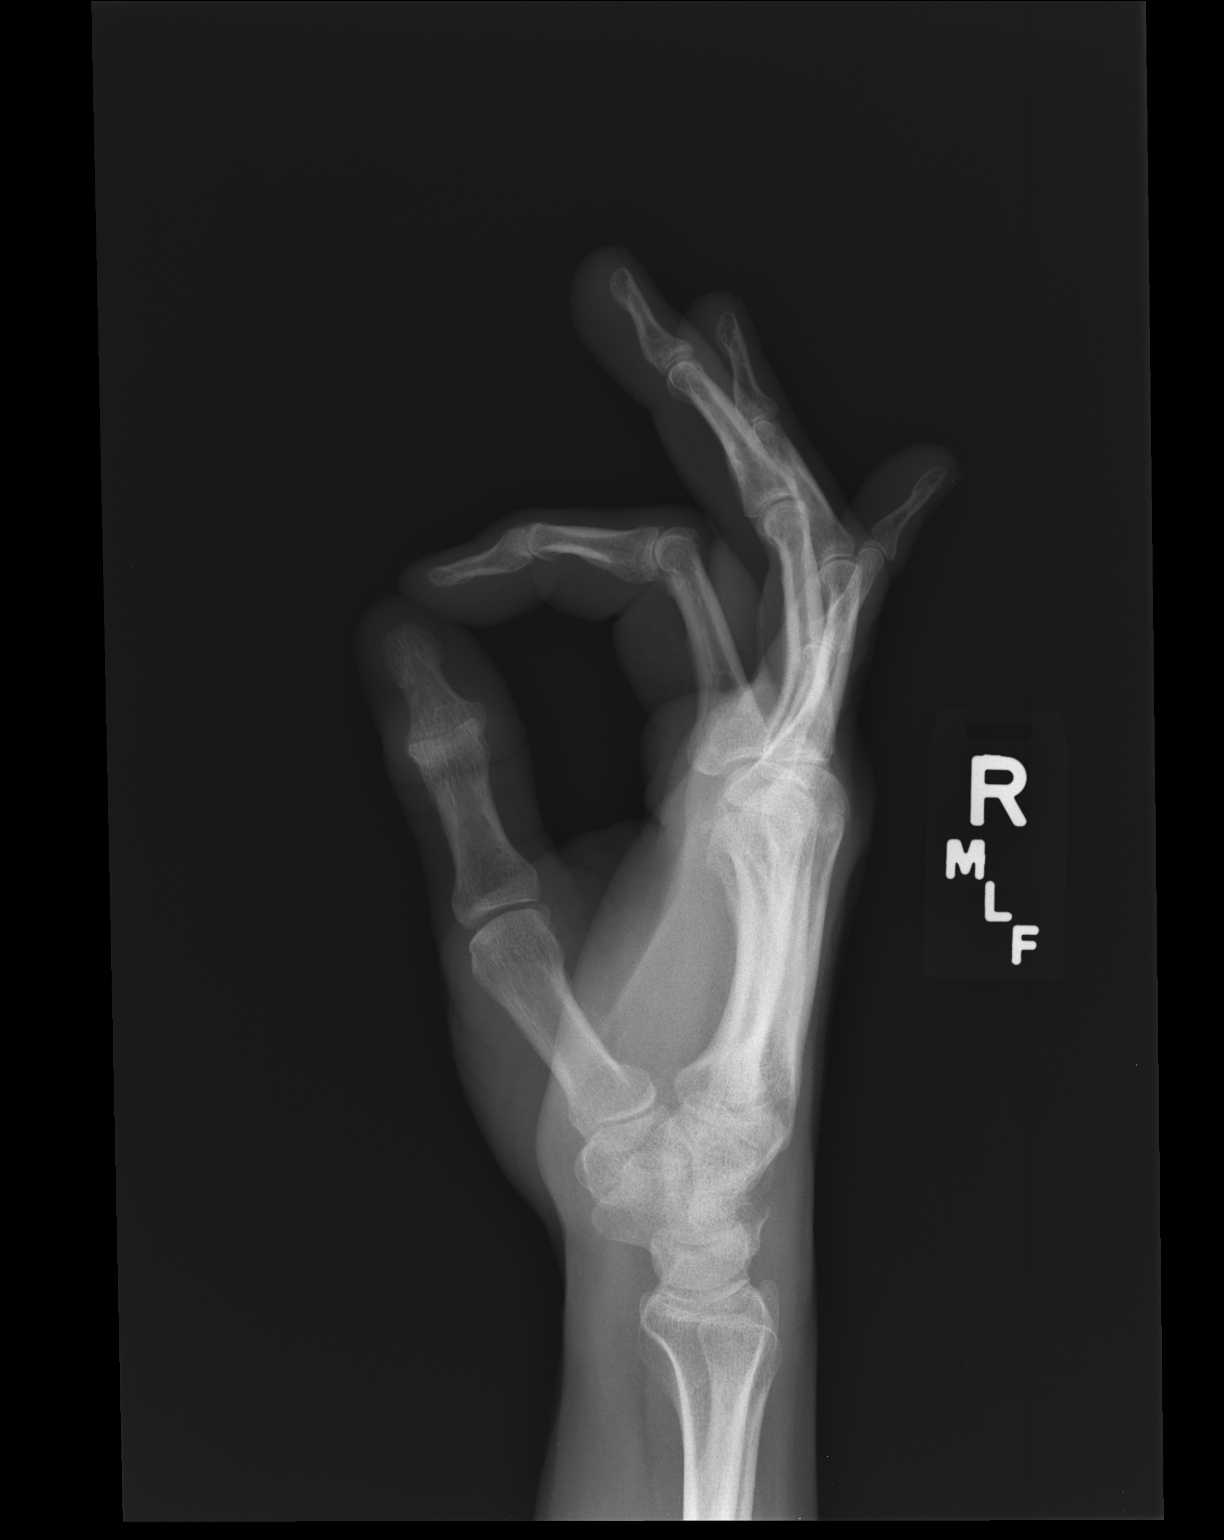

[pa obl]
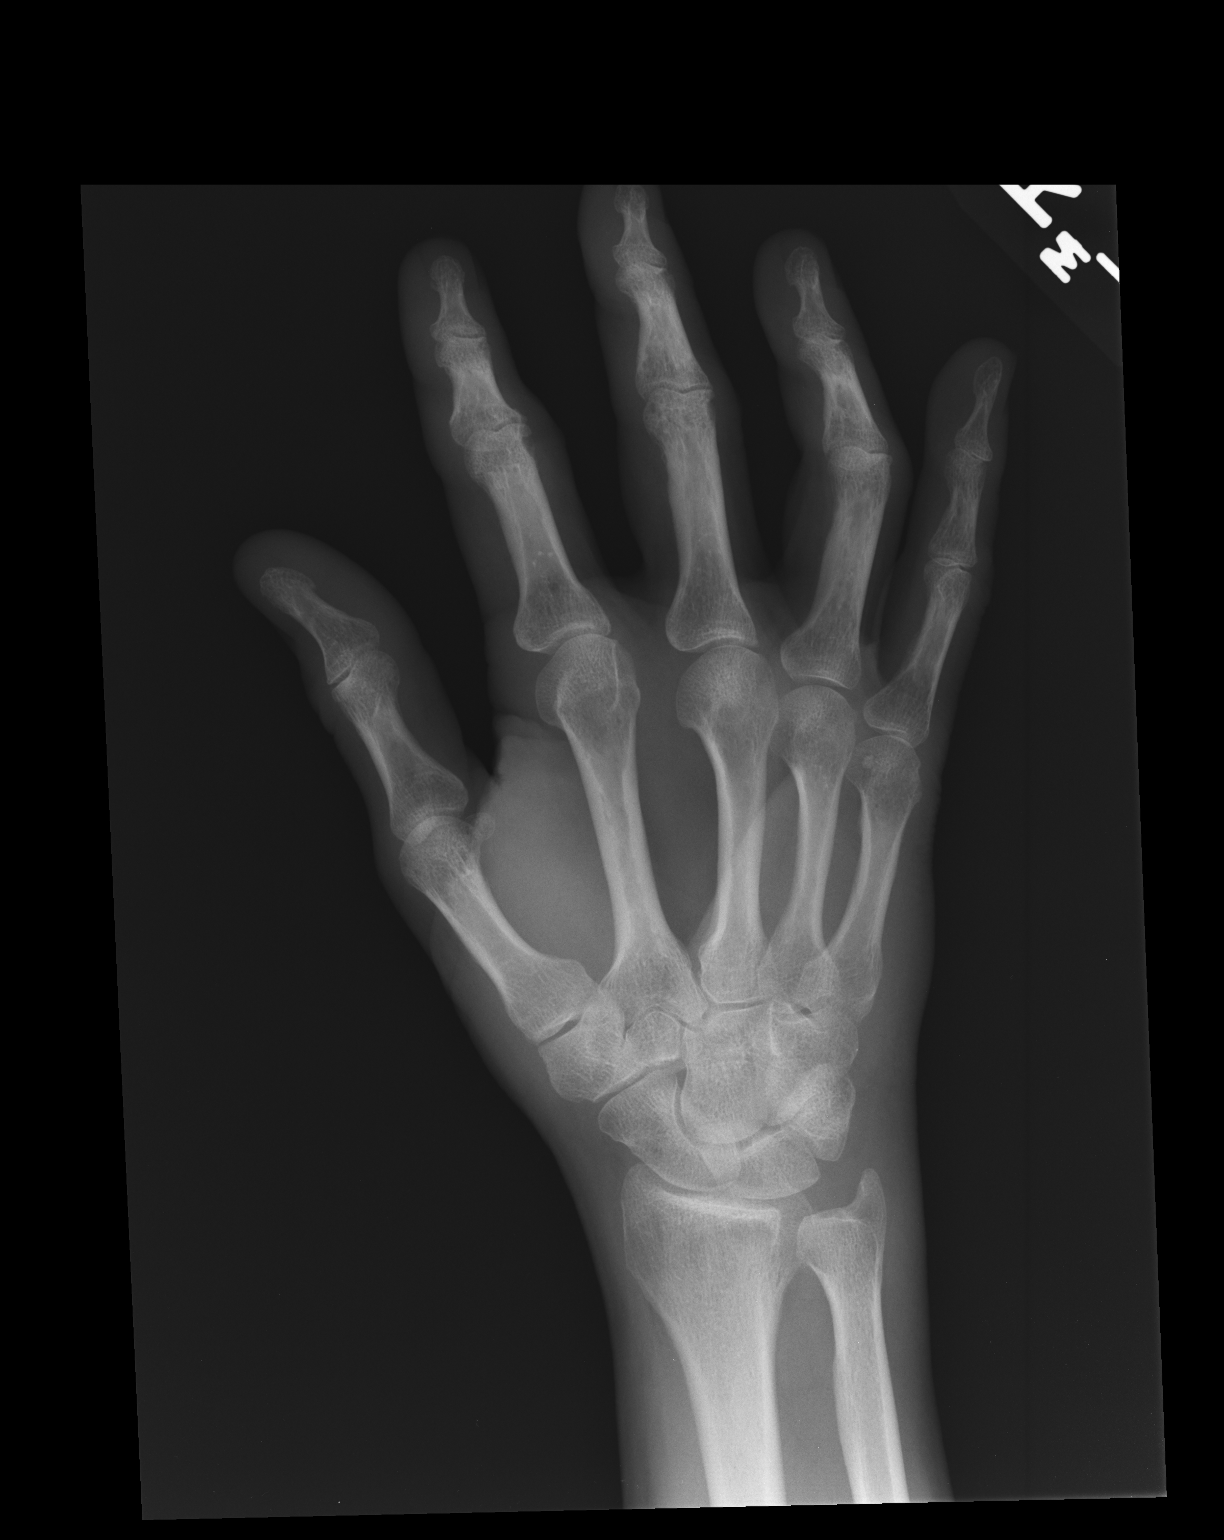

[3 of 3 positions shown; findings below may reference images not displayed]

FINDINGS: There is no evidence of fracture or dislocation. There is no
evidence of arthropathy or other focal bone abnormality. Soft
tissues are unremarkable.
IMPRESSION: Negative.

## 2016-11-28 ENCOUNTER — Ambulatory Visit (INDEPENDENT_AMBULATORY_CARE_PROVIDER_SITE_OTHER): Payer: Medicare Other | Admitting: Family Medicine

## 2016-11-28 ENCOUNTER — Encounter: Payer: Self-pay | Admitting: Family Medicine

## 2016-11-28 VITALS — BP 120/70 | HR 62 | Wt 173.8 lb

## 2016-11-28 DIAGNOSIS — Z23 Encounter for immunization: Secondary | ICD-10-CM | POA: Diagnosis not present

## 2016-11-28 DIAGNOSIS — S60031A Contusion of right middle finger without damage to nail, initial encounter: Secondary | ICD-10-CM

## 2016-11-28 NOTE — Progress Notes (Signed)
   Subjective:    Patient ID: Neil Rivera, male    DOB: [birth year], 71 y.o.   MRN: [id number]  HPI He injured his right third finger in an accident at home last night. He complains of swelling and discoloration of the PIP joint.   Review of Systems     Objective:   Physical Exam The palmar surface of the PIP joint right third finger shows ecchymosis however he does have normal motion of the finger. No tenderness palpation of the phalanges. The PIP joint is slightly edematous.       Assessment & Plan:  Contusion of right middle finger without damage to nail, initial encounter  Need for influenza vaccination I explained that I thought he had a contusion to that finger but did not break anything. Recommend conservative care. He was comfortable with that.

## 2017-03-16 ENCOUNTER — Other Ambulatory Visit: Payer: Self-pay | Admitting: Family Medicine

## 2017-08-20 ENCOUNTER — Ambulatory Visit (INDEPENDENT_AMBULATORY_CARE_PROVIDER_SITE_OTHER): Payer: Medicare Other | Admitting: Family Medicine

## 2017-08-20 ENCOUNTER — Encounter: Payer: Self-pay | Admitting: Family Medicine

## 2017-08-20 VITALS — BP 128/82 | HR 66 | Temp 97.5°F | Ht 68.75 in | Wt 166.0 lb

## 2017-08-20 DIAGNOSIS — Z86010 Personal history of colonic polyps: Secondary | ICD-10-CM | POA: Diagnosis not present

## 2017-08-20 DIAGNOSIS — I10 Essential (primary) hypertension: Secondary | ICD-10-CM | POA: Diagnosis not present

## 2017-08-20 DIAGNOSIS — E785 Hyperlipidemia, unspecified: Secondary | ICD-10-CM | POA: Diagnosis not present

## 2017-08-20 DIAGNOSIS — E039 Hypothyroidism, unspecified: Secondary | ICD-10-CM | POA: Diagnosis not present

## 2017-08-20 DIAGNOSIS — Z8249 Family history of ischemic heart disease and other diseases of the circulatory system: Secondary | ICD-10-CM

## 2017-08-20 DIAGNOSIS — Z833 Family history of diabetes mellitus: Secondary | ICD-10-CM | POA: Diagnosis not present

## 2017-08-20 MED ORDER — LEVOTHYROXINE SODIUM 100 MCG PO TABS: ORAL_TABLET | ORAL | 0 refills | Status: DC

## 2017-08-20 MED ORDER — ATORVASTATIN CALCIUM 20 MG PO TABS: 20.0000 mg | ORAL_TABLET | Freq: Every day | ORAL | 3 refills | Status: DC

## 2017-08-20 MED ORDER — HYDROCHLOROTHIAZIDE 25 MG PO TABS: 12.5000 mg | ORAL_TABLET | Freq: Every day | ORAL | 3 refills | Status: DC

## 2017-08-20 NOTE — Progress Notes (Signed)
Neil Rivera is a 72 y.o. male who presents for annual wellness visit and follow-up on chronic medical conditions.  He has the following concerns: He continues on Synthroid and is having no difficulty with that.  He is also taking atorvastatin and having no aches or pains.  Continue on his blood pressure medications and no particular some concerns about them.  He is having very little difficulty with his reflux symptoms.   Immunizations and Health Maintenance Immunization History  Administered Date(s) Administered  . DTaP 11/23/2003  . Influenza Split 10/05/2010, 11/08/2011  . Influenza, High Dose Seasonal PF 11/16/2012, 11/28/2016  . Pneumococcal Conjugate-13 06/29/2013  . Pneumococcal Polysaccharide-23 05/08/2007  . Zoster 05/08/2007   Health Maintenance Due  Topic Date Due  . TETANUS/TDAP  05/24/1965  . PNA vac Low Risk Adult (2 of 2 - PPSV23) 06/30/2014    Last colonoscopy: 3 years ago Last PSA: last year Dentist: 07-2017 Ophtho:2018 Exercise: walking wieghts   Other doctors caring for patient include: Buccini  Advanced Directives: yes    Depression screen:  See questionnaire below.     Depression screen [facility] 2/9 08/20/2017 08/14/2016 07/13/2015 02/05/2015 02/02/2015  Decreased Interest 0 0 0 0 0  Down, Depressed, Hopeless 0 0 0 0 0  PHQ - 2 Score 0 0 0 0 0    Fall Screen: See Questionaire below.   Fall Risk  08/20/2017 08/14/2016 07/13/2015 02/02/2015 07/06/2014  Falls in the past year? No No No Yes No  Number falls in past yr: - - - 1 -  Injury with Fall? - - - No -    ADL screen:  See questionnaire below.  Functional Status Survey: Is the patient deaf or have difficulty hearing?: No Does the patient have difficulty seeing, even when wearing glasses/contacts?: No Does the patient have difficulty concentrating, remembering, or making decisions?: No Does the patient have difficulty walking or climbing stairs?: No Does the patient have difficulty dressing or bathing?:  No Does the patient have difficulty doing errands alone such as visiting a doctor's office or shopping?: No   Review of Systems  Constitutional: -, -unexpected weight change, -anorexia, -fatigue Allergy: -sneezing, -itching, -congestion Dermatology: denies changing moles, rash, lumps ENT: -runny nose, -ear pain, -sore throat,  Cardiology:  -chest pain, -palpitations, -orthopnea, Respiratory: -cough, -shortness of breath, -dyspnea on exertion, -wheezing,  Gastroenterology: -abdominal pain, -nausea, -vomiting, -diarrhea, -constipation, -dysphagia Hematology: -bleeding or bruising problems Musculoskeletal: -arthralgias, -myalgias, -joint swelling, -back pain, - Ophthalmology: -vision changes,  Urology: -dysuria, -difficulty urinating,  -urinary frequency, -urgency, incontinence Neurology: -, -numbness, , -memory loss, -falls, -dizziness    PHYSICAL EXAM:  General Appearance: Alert, cooperative, no distress, appears stated age Head: Normocephalic, without obvious abnormality, atraumatic Eyes: PERRL, conjunctiva/corneas clear, EOM's intact, fundi benign Ears: Normal TM's and external ear canals Nose: Nares normal, mucosa normal, no drainage or sinus   tenderness Throat: Lips, mucosa, and tongue normal; teeth and gums normal Neck: Supple, no lymphadenopathy, thyroid:no enlargement/tenderness/nodules; no carotid bruit or JVD Lungs: Clear to auscultation bilaterally without wheezes, rales or ronchi; respirations unlabored Heart: Regular rate and rhythm, S1 and S2 normal, no murmur, rub or gallop Abdomen: Soft, non-tender, nondistended, normoactive bowel sounds, no masses, no hepatosplenomegaly Extremities: No clubbing, cyanosis or edema Pulses: 2+ and symmetric all extremities Skin: Skin color, texture, turgor normal, no rashes or lesions Lymph nodes: Cervical, supraclavicular, and axillary nodes normal Neurologic: CNII-XII intact, normal strength, sensation and gait; reflexes 2+ and  symmetric throughout   Psych: Normal mood,  affect, hygiene and grooming  ASSESSMENT/PLAN: Family history of diabetes mellitus - Plan: CBC with Differential/Platelet, Comprehensive metabolic panel  Family history of heart disease in male family member before age 35 - Plan: CBC with Differential/Platelet, Comprehensive metabolic panel, Lipid panel  History of colonic polyps  Hyperlipidemia with target LDL less than 100 - Plan: Lipid panel  Essential hypertension  Hypothyroidism, unspecified type - Plan: TSH       Medicare Attestation I have personally reviewed: The patient's medical and social history Their use of alcohol, tobacco or illicit drugs Their current medications and supplements The patient's functional ability including ADLs,fall risks, home safety risks, cognitive, and hearing and visual impairment Diet and physical activities Evidence for depression or mood disorders  The patient's weight, height, and BMI have been recorded in the chart.  I have made referrals, counseling, and provided education to the patient based on review of the above and I have provided the patient with a written personalized care plan for preventive services.     Jill Alexanders, MD   08/20/2017

## 2017-08-21 LAB — CBC WITH DIFFERENTIAL/PLATELET
BASOS ABS: 0.1 10*3/uL (ref 0.0–0.2)
Basos: 1 %
EOS (ABSOLUTE): 0.2 10*3/uL (ref 0.0–0.4)
Eos: 2 %
HEMOGLOBIN: 17.1 g/dL (ref 13.0–17.7)
Hematocrit: 50.8 % (ref 37.5–51.0)
Immature Grans (Abs): 0.1 10*3/uL (ref 0.0–0.1)
Immature Granulocytes: 1 %
LYMPHS ABS: 1.2 10*3/uL (ref 0.7–3.1)
Lymphs: 14 %
MCH: 32.5 pg (ref 26.6–33.0)
MCHC: 33.7 g/dL (ref 31.5–35.7)
MCV: 97 fL (ref 79–97)
MONOCYTES: 9 %
MONOS ABS: 0.8 10*3/uL (ref 0.1–0.9)
NEUTROS ABS: 6.4 10*3/uL (ref 1.4–7.0)
Neutrophils: 73 %
Platelets: 169 10*3/uL (ref 150–450)
RBC: 5.26 x10E6/uL (ref 4.14–5.80)
RDW: 12.1 % — AB (ref 12.3–15.4)
WBC: 8.7 10*3/uL (ref 3.4–10.8)

## 2017-08-21 LAB — COMPREHENSIVE METABOLIC PANEL
ALBUMIN: 4.7 g/dL (ref 3.5–4.8)
ALK PHOS: 101 IU/L (ref 39–117)
ALT: 24 IU/L (ref 0–44)
AST: 22 IU/L (ref 0–40)
Albumin/Globulin Ratio: 1.7 (ref 1.2–2.2)
BUN / CREAT RATIO: 16 (ref 10–24)
BUN: 17 mg/dL (ref 8–27)
Bilirubin Total: 0.9 mg/dL (ref 0.0–1.2)
CO2: 24 mmol/L (ref 20–29)
CREATININE: 1.05 mg/dL (ref 0.76–1.27)
Calcium: 9.6 mg/dL (ref 8.6–10.2)
Chloride: 101 mmol/L (ref 96–106)
GFR calc Af Amer: 82 mL/min/{1.73_m2} (ref >59)
GFR calc non Af Amer: 71 mL/min/{1.73_m2} (ref >59)
GLUCOSE: 103 mg/dL — AB (ref 65–99)
Globulin, Total: 2.8 g/dL (ref 1.5–4.5)
Potassium: 3.7 mmol/L (ref 3.5–5.2)
Sodium: 144 mmol/L (ref 134–144)
Total Protein: 7.5 g/dL (ref 6.0–8.5)

## 2017-08-21 LAB — LIPID PANEL
CHOLESTEROL TOTAL: 166 mg/dL (ref 100–199)
Chol/HDL Ratio: 2.9 ratio (ref 0.0–5.0)
HDL: 57 mg/dL (ref >39)
LDL CALC: 89 mg/dL (ref 0–99)
TRIGLYCERIDES: 101 mg/dL (ref 0–149)
VLDL CHOLESTEROL CAL: 20 mg/dL (ref 5–40)

## 2017-08-21 LAB — TSH: TSH: 0.979 u[IU]/mL (ref 0.450–4.500)

## 2017-09-02 DIAGNOSIS — D225 Melanocytic nevi of trunk: Secondary | ICD-10-CM | POA: Diagnosis not present

## 2017-09-02 DIAGNOSIS — L821 Other seborrheic keratosis: Secondary | ICD-10-CM | POA: Diagnosis not present

## 2017-09-02 DIAGNOSIS — B353 Tinea pedis: Secondary | ICD-10-CM | POA: Diagnosis not present

## 2017-09-02 DIAGNOSIS — L814 Other melanin hyperpigmentation: Secondary | ICD-10-CM | POA: Diagnosis not present

## 2017-09-18 DIAGNOSIS — H5213 Myopia, bilateral: Secondary | ICD-10-CM | POA: Diagnosis not present

## 2017-09-18 DIAGNOSIS — H52223 Regular astigmatism, bilateral: Secondary | ICD-10-CM | POA: Diagnosis not present

## 2017-09-18 DIAGNOSIS — H2513 Age-related nuclear cataract, bilateral: Secondary | ICD-10-CM | POA: Diagnosis not present

## 2017-09-18 DIAGNOSIS — Q149 Congenital malformation of posterior segment of eye, unspecified: Secondary | ICD-10-CM | POA: Diagnosis not present

## 2017-12-09 ENCOUNTER — Other Ambulatory Visit: Payer: Self-pay | Admitting: Family Medicine

## 2017-12-09 DIAGNOSIS — E039 Hypothyroidism, unspecified: Secondary | ICD-10-CM

## 2018-03-02 ENCOUNTER — Other Ambulatory Visit: Payer: Self-pay | Admitting: Family Medicine

## 2018-03-02 DIAGNOSIS — E039 Hypothyroidism, unspecified: Secondary | ICD-10-CM

## 2018-04-08 ENCOUNTER — Telehealth: Payer: Self-pay | Admitting: Family Medicine

## 2018-04-08 NOTE — Telephone Encounter (Signed)
Pt called and is wanting your recommendations on whether or not he should go visit a 72 year old relative states the relative is coughing and wheezing some, and they are in [place], he wanted to see what you thought before going, he can be reached at  [phone]

## 2018-06-02 ENCOUNTER — Other Ambulatory Visit: Payer: Self-pay | Admitting: Family Medicine

## 2018-06-02 DIAGNOSIS — E039 Hypothyroidism, unspecified: Secondary | ICD-10-CM

## 2018-09-04 ENCOUNTER — Other Ambulatory Visit: Payer: Self-pay | Admitting: Family Medicine

## 2018-09-04 DIAGNOSIS — E039 Hypothyroidism, unspecified: Secondary | ICD-10-CM

## 2018-09-17 ENCOUNTER — Telehealth: Payer: Self-pay

## 2018-09-17 ENCOUNTER — Other Ambulatory Visit: Payer: Self-pay | Admitting: Family Medicine

## 2018-09-17 DIAGNOSIS — I10 Essential (primary) hypertension: Secondary | ICD-10-CM

## 2018-09-17 NOTE — Telephone Encounter (Signed)
LVM for pt to call back for covid screening questions . [place]

## 2018-09-22 ENCOUNTER — Other Ambulatory Visit: Payer: Self-pay

## 2018-09-22 ENCOUNTER — Ambulatory Visit (INDEPENDENT_AMBULATORY_CARE_PROVIDER_SITE_OTHER): Payer: Medicare HMO | Admitting: Family Medicine

## 2018-09-22 ENCOUNTER — Encounter: Payer: Self-pay | Admitting: Family Medicine

## 2018-09-22 VITALS — BP 110/70 | HR 89 | Temp 99.1°F | Ht 68.0 in | Wt 162.8 lb

## 2018-09-22 DIAGNOSIS — Z8249 Family history of ischemic heart disease and other diseases of the circulatory system: Secondary | ICD-10-CM | POA: Diagnosis not present

## 2018-09-22 DIAGNOSIS — E785 Hyperlipidemia, unspecified: Secondary | ICD-10-CM

## 2018-09-22 DIAGNOSIS — E039 Hypothyroidism, unspecified: Secondary | ICD-10-CM

## 2018-09-22 DIAGNOSIS — Z8612 Personal history of poliomyelitis: Secondary | ICD-10-CM | POA: Diagnosis not present

## 2018-09-22 DIAGNOSIS — Z833 Family history of diabetes mellitus: Secondary | ICD-10-CM | POA: Diagnosis not present

## 2018-09-22 DIAGNOSIS — I10 Essential (primary) hypertension: Secondary | ICD-10-CM

## 2018-09-22 DIAGNOSIS — Z860100 Personal history of colon polyps, unspecified: Secondary | ICD-10-CM

## 2018-09-22 LAB — POCT URINALYSIS DIP (PROADVANTAGE DEVICE)
Blood, UA: NEGATIVE
Glucose, UA: NEGATIVE mg/dL
Ketones, POC UA: NEGATIVE mg/dL
Nitrite, UA: NEGATIVE
Protein Ur, POC: NEGATIVE mg/dL
Specific Gravity, Urine: 1.03
Urobilinogen, Ur: 0.2
pH, UA: 6 (ref 5.0–8.0)

## 2018-09-22 MED ORDER — LEVOTHYROXINE SODIUM 100 MCG PO TABS: 100.0000 ug | ORAL_TABLET | Freq: Every day | ORAL | 0 refills | Status: DC

## 2018-09-22 MED ORDER — ATORVASTATIN CALCIUM 20 MG PO TABS: 20.0000 mg | ORAL_TABLET | Freq: Every day | ORAL | 3 refills | Status: DC

## 2018-09-22 MED ORDER — HYDROCHLOROTHIAZIDE 25 MG PO TABS: 12.5000 mg | ORAL_TABLET | Freq: Every day | ORAL | 3 refills | Status: DC

## 2018-09-22 NOTE — Addendum Note (Signed)
Addended by: Elyse Jarvis on: 09/22/2018 04:20 PM   Modules accepted: Orders

## 2018-09-22 NOTE — Progress Notes (Addendum)
Neil Rivera is a 73 y.o. male who presents for annual wellness visit and follow-up on chronic medical conditions.  He has no particular concerns or complaints.  He continues on his blood pressure medications as well as thyroid med and having no difficulty with that.  He is also taking Lipitor without trouble.  Does have a history of colonic polyps and is scheduled for routine follow-up on that.  Interestingly he does have a history of polio when he was a child.  There is also a family history of heart disease.  He does not complain of chest pain, shortness of breath, PND or DOE.  Otherwise his family and social history is unremarkable.  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   Immunizations and Health Maintenance Immunization History  Administered Date(s) Administered  . DTaP 11/23/2003  . Influenza Split 10/05/2010, 11/08/2011  . Influenza, High Dose Seasonal PF 11/16/2012, 11/28/2016  . Pneumococcal Conjugate-13 06/29/2013  . Pneumococcal Polysaccharide-23 05/08/2007  . Zoster 05/08/2007   Health Maintenance Due  Topic Date Due  . TETANUS/TDAP  05/24/1965  . PNA vac Low Risk Adult (2 of 2 - PPSV23) 06/30/2014  . INFLUENZA VACCINE  08/22/2018    Last colonoscopy: 10/19/15 Last PSA: 11/08/11 Dentist: 08/2018 Ophtho: summer 2019 Exercise: walking Qd Other doctors caring for patient include: Marin Roberts   Advanced Directives:None on file     Depression screen:  See questionnaire below.     Depression screen [name] 2/9 09/22/2018 08/20/2017 08/14/2016 07/13/2015 02/05/2015  Decreased Interest 0 0 0 0 0  Down, Depressed, Hopeless 0 0 0 0 0  PHQ - 2 Score 0 0 0 0 0    Fall Screen: See Questionaire below.   Fall Risk  09/22/2018 08/20/2017 08/14/2016 07/13/2015 02/02/2015  Falls in the past year? 0 No No No Yes  Number falls in past yr: - - - - 1  Injury with Fall? - - - - No    ADL screen:  See questionnaire below.  Functional  Status Survey: Is the patient deaf or have difficulty hearing?: No Does the patient have difficulty seeing, even when wearing glasses/contacts?: No Does the patient have difficulty concentrating, remembering, or making decisions?: No Does the patient have difficulty walking or climbing stairs?: No Does the patient have difficulty dressing or bathing?: No Does the patient have difficulty doing errands alone such as visiting a doctor's office or shopping?: No   Review of Systems  Constitutional: -, -unexpected weight change, -anorexia, -fatigue Allergy: -sneezing, -itching, -congestion Dermatology: denies changing moles, rash, lumps ENT: -runny nose, -ear pain, -sore throat,  Cardiology:  -chest pain, -palpitations, -orthopnea, Respiratory: -cough, -shortness of breath, -dyspnea on exertion, -wheezing,  Gastroenterology: -abdominal pain, -nausea, -vomiting, -diarrhea, -constipation, -dysphagia Hematology: -bleeding or bruising problems Musculoskeletal: -arthralgias, -myalgias, -joint swelling, -back pain, - Ophthalmology: -vision changes,  Urology: -dysuria, -difficulty urinating,  -urinary frequency, -urgency, incontinence Neurology: -, -numbness, , -memory loss, -falls, -dizziness    PHYSICAL EXAM:   General Appearance: Alert, cooperative, no distress, appears stated age Head: Normocephalic, without obvious abnormality, atraumatic Eyes: PERRL, conjunctiva/corneas clear, EOM's intact, fundi benign Ears: Normal TM's and external ear canals Nose: Nares normal, mucosa normal, no drainage or sinus   tenderness Throat: Lips, mucosa, and tongue normal; teeth and gums normal Neck: Supple, no lymphadenopathy, thyroid:no enlargement/tenderness/nodules; no carotid bruit or JVD Lungs: Clear to auscultation bilaterally without wheezes, rales or ronchi; respirations unlabored Heart: Regular rate and  rhythm, S1 and S2 normal, no murmur, rub or gallop Extremities: No clubbing, cyanosis or  edema Pulses: 2+ and symmetric all extremities Skin: Skin color, texture, turgor normal, no rashes or lesions Lymph nodes: Cervical, supraclavicular, and axillary nodes normal Neurologic: CNII-XII intact, normal strength, sensation and gait; reflexes 2+ and symmetric throughout   Psych: Normal mood, affect, hygiene and grooming Urine microscopic was negative ASSESSMENT/PLAN: Essential hypertension - Plan: CBC with Differential/Platelet, Comprehensive metabolic panel, hydrochlorothiazide (HYDRODIURIL) 25 MG tablet  Hypothyroidism, unspecified type - Plan: TSH, levothyroxine (SYNTHROID) 100 MCG tablet  Family history of diabetes mellitus - Plan: CBC with Differential/Platelet, Comprehensive metabolic panel, Lipid panel  Family history of heart disease in male family member before age 47  Hyperlipidemia with target LDL less than 100 - Plan: atorvastatin (LIPITOR) 20 MG tablet  History of poliomyelitis  History of colonic polyps Immunization recommendations discussed.  Colonoscopy recommendations reviewed. Encouraged him to continue to take good care of himself.  Also encouraged him to enjoy his impending retirement. Medicare Attestation I have personally reviewed: The patient's medical and social history Their use of alcohol, tobacco or illicit drugs Their current medications and supplements The patient's functional ability including ADLs,fall risks, home safety risks, cognitive, and hearing and visual impairment Diet and physical activities Evidence for depression or mood disorders  The patient's weight, height, and BMI have been recorded in the chart.  I have made referrals, counseling, and provided education to the patient based on review of the above and I have provided the patient with a written personalized care plan for preventive services.     Jill Alexanders, MD   09/22/2018

## 2018-09-22 NOTE — Patient Instructions (Signed)
  Mr. Neil Rivera , Thank you for taking time to come for your Medicare Wellness Visit. I appreciate your ongoing commitment to your health goals. Please review the following plan we discussed and let me know if I can assist you in the future.   These are the goals we discussed: Continue to take good care of yourself. This is a list of the screening recommended for you and due dates:  Health Maintenance  Topic Date Due  . Tetanus Vaccine  05/24/1965  . Pneumonia vaccines (2 of 2 - PPSV23) 06/30/2014  . Flu Shot  08/22/2018  . Colon Cancer Screening  10/18/2020  .  Hepatitis C: One time screening is recommended by Center for Disease Control  (CDC) for  adults born from [birth year] through 1964.   Completed

## 2018-09-23 LAB — COMPREHENSIVE METABOLIC PANEL
ALT: 29 IU/L (ref 0–44)
AST: 30 IU/L (ref 0–40)
Albumin/Globulin Ratio: 1.9 (ref 1.2–2.2)
Albumin: 4 g/dL (ref 3.7–4.7)
Alkaline Phosphatase: 74 IU/L (ref 39–117)
BUN/Creatinine Ratio: 18 (ref 10–24)
BUN: 18 mg/dL (ref 8–27)
Bilirubin Total: 0.6 mg/dL (ref 0.0–1.2)
CO2: 24 mmol/L (ref 20–29)
Calcium: 9.1 mg/dL (ref 8.6–10.2)
Chloride: 102 mmol/L (ref 96–106)
Creatinine, Ser: 1.02 mg/dL (ref 0.76–1.27)
GFR calc Af Amer: 84 mL/min/{1.73_m2} (ref >59)
GFR calc non Af Amer: 73 mL/min/{1.73_m2} (ref >59)
Globulin, Total: 2.1 g/dL (ref 1.5–4.5)
Glucose: 102 mg/dL — ABNORMAL HIGH (ref 65–99)
Potassium: 3.5 mmol/L (ref 3.5–5.2)
Sodium: 141 mmol/L (ref 134–144)
Total Protein: 6.1 g/dL (ref 6.0–8.5)

## 2018-09-23 LAB — CBC WITH DIFFERENTIAL/PLATELET
Basophils Absolute: 0.1 10*3/uL (ref 0.0–0.2)
Basos: 1 %
EOS (ABSOLUTE): 0.3 10*3/uL (ref 0.0–0.4)
Eos: 5 %
Hematocrit: 46.9 % (ref 37.5–51.0)
Hemoglobin: 16.6 g/dL (ref 13.0–17.7)
Immature Grans (Abs): 0 10*3/uL (ref 0.0–0.1)
Immature Granulocytes: 0 %
Lymphocytes Absolute: 1.2 10*3/uL (ref 0.7–3.1)
Lymphs: 18 %
MCH: 34.1 pg — ABNORMAL HIGH (ref 26.6–33.0)
MCHC: 35.4 g/dL (ref 31.5–35.7)
MCV: 96 fL (ref 79–97)
Monocytes Absolute: 0.9 10*3/uL (ref 0.1–0.9)
Monocytes: 13 %
Neutrophils Absolute: 4.4 10*3/uL (ref 1.4–7.0)
Neutrophils: 63 %
Platelets: 148 10*3/uL — ABNORMAL LOW (ref 150–450)
RBC: 4.87 x10E6/uL (ref 4.14–5.80)
RDW: 11.8 % (ref 11.6–15.4)
WBC: 6.9 10*3/uL (ref 3.4–10.8)

## 2018-09-23 LAB — LIPID PANEL
Chol/HDL Ratio: 2 ratio (ref 0.0–5.0)
Cholesterol, Total: 103 mg/dL (ref 100–199)
HDL: 51 mg/dL (ref >39)
LDL Chol Calc (NIH): 36 mg/dL (ref 0–99)
Triglycerides: 77 mg/dL (ref 0–149)
VLDL Cholesterol Cal: 16 mg/dL (ref 5–40)

## 2018-09-23 LAB — TSH: TSH: 0.458 u[IU]/mL (ref 0.450–4.500)

## 2018-10-05 ENCOUNTER — Telehealth: Payer: Self-pay

## 2018-10-05 NOTE — Telephone Encounter (Signed)
Pt was given lab results. [place]

## 2019-03-04 ENCOUNTER — Other Ambulatory Visit: Payer: Self-pay | Admitting: Family Medicine

## 2019-03-04 DIAGNOSIS — E039 Hypothyroidism, unspecified: Secondary | ICD-10-CM

## 2019-04-12 DIAGNOSIS — R69 Illness, unspecified: Secondary | ICD-10-CM | POA: Diagnosis not present

## 2019-05-25 ENCOUNTER — Encounter: Payer: Self-pay | Admitting: Family Medicine

## 2019-05-25 ENCOUNTER — Ambulatory Visit (INDEPENDENT_AMBULATORY_CARE_PROVIDER_SITE_OTHER): Payer: Medicare HMO | Admitting: Family Medicine

## 2019-05-25 ENCOUNTER — Other Ambulatory Visit: Payer: Self-pay

## 2019-05-25 VITALS — BP 160/100 | HR 79 | Temp 98.0°F | Wt 163.6 lb

## 2019-05-25 DIAGNOSIS — R3989 Other symptoms and signs involving the genitourinary system: Secondary | ICD-10-CM | POA: Diagnosis not present

## 2019-05-25 LAB — POCT URINALYSIS DIP (PROADVANTAGE DEVICE)
Bilirubin, UA: NEGATIVE
Blood, UA: NEGATIVE
Glucose, UA: NEGATIVE mg/dL
Ketones, POC UA: NEGATIVE mg/dL
Leukocytes, UA: NEGATIVE
Nitrite, UA: NEGATIVE
Protein Ur, POC: NEGATIVE mg/dL
Specific Gravity, Urine: 1.02
Urobilinogen, Ur: 0.2
pH, UA: 6 (ref 5.0–8.0)

## 2019-05-25 NOTE — Progress Notes (Signed)
   Subjective:    Patient ID: Neil Rivera, male    DOB: 1945/10/18, 74 y.o.   MRN: [id number]  HPI He is here for consult concerning a change in the color of his urine.  He thinks it is coming out brownish and is done so over the last week several times.  He usually notes this at the beginning of urination.  He has had no frequency, urgency, discharge, dysuria, abdominal discomfort.   Review of Systems     Objective:   Physical Exam Alert and in no distress.  Lower abdominal and genital exam is entirely normal.  Urine dipstick and microscopic was negative for red cells.       Assessment & Plan:  Abnormal urine color - Plan: POCT Urinalysis DIP (Proadvantage Device) I explained that it does sound like he has blood in his urine that is intermittent in nature.  Since he is really not having any specific symptoms leading in 1 direction or another, we do need to get a specimen that has the abnormal color so this can be evaluated.  He was given some urine containers and will bring them back for further evaluation.

## 2019-05-27 ENCOUNTER — Other Ambulatory Visit: Payer: Self-pay

## 2019-05-27 ENCOUNTER — Other Ambulatory Visit (INDEPENDENT_AMBULATORY_CARE_PROVIDER_SITE_OTHER): Payer: Medicare HMO

## 2019-05-27 DIAGNOSIS — R3989 Other symptoms and signs involving the genitourinary system: Secondary | ICD-10-CM | POA: Diagnosis not present

## 2019-05-27 LAB — POCT URINALYSIS DIP (PROADVANTAGE DEVICE)
Bilirubin, UA: NEGATIVE
Glucose, UA: NEGATIVE mg/dL
Ketones, POC UA: NEGATIVE mg/dL
Leukocytes, UA: NEGATIVE
Nitrite, UA: NEGATIVE
Specific Gravity, Urine: 1.025
Urobilinogen, Ur: NEGATIVE
pH, UA: 6 (ref 5.0–8.0)

## 2019-05-31 ENCOUNTER — Other Ambulatory Visit: Payer: Self-pay

## 2019-05-31 DIAGNOSIS — R319 Hematuria, unspecified: Secondary | ICD-10-CM

## 2019-06-01 ENCOUNTER — Other Ambulatory Visit: Payer: Medicare HMO

## 2019-06-01 ENCOUNTER — Other Ambulatory Visit: Payer: Self-pay

## 2019-06-01 DIAGNOSIS — R319 Hematuria, unspecified: Secondary | ICD-10-CM

## 2019-06-01 LAB — CBC WITH DIFFERENTIAL/PLATELET
Basophils Absolute: 0.1 10*3/uL (ref 0.0–0.2)
Basos: 1 %
EOS (ABSOLUTE): 0.2 10*3/uL (ref 0.0–0.4)
Eos: 3 %
Hematocrit: 47.3 % (ref 37.5–51.0)
Hemoglobin: 16.3 g/dL (ref 13.0–17.7)
Immature Grans (Abs): 0 10*3/uL (ref 0.0–0.1)
Immature Granulocytes: 0 %
Lymphocytes Absolute: 1.3 10*3/uL (ref 0.7–3.1)
Lymphs: 20 %
MCH: 33.9 pg — ABNORMAL HIGH (ref 26.6–33.0)
MCHC: 34.5 g/dL (ref 31.5–35.7)
MCV: 98 fL — ABNORMAL HIGH (ref 79–97)
Monocytes Absolute: 0.8 10*3/uL (ref 0.1–0.9)
Monocytes: 12 %
Neutrophils Absolute: 4.3 10*3/uL (ref 1.4–7.0)
Neutrophils: 64 %
Platelets: 148 10*3/uL — ABNORMAL LOW (ref 150–450)
RBC: 4.81 x10E6/uL (ref 4.14–5.80)
RDW: 11.8 % (ref 11.6–15.4)
WBC: 6.7 10*3/uL (ref 3.4–10.8)

## 2019-06-01 LAB — COMPREHENSIVE METABOLIC PANEL
ALT: 26 IU/L (ref 0–44)
AST: 22 IU/L (ref 0–40)
Albumin/Globulin Ratio: 1.8 (ref 1.2–2.2)
Albumin: 4.3 g/dL (ref 3.7–4.7)
Alkaline Phosphatase: 87 IU/L (ref 39–117)
BUN/Creatinine Ratio: 20 (ref 10–24)
BUN: 20 mg/dL (ref 8–27)
Bilirubin Total: 1 mg/dL (ref 0.0–1.2)
CO2: 24 mmol/L (ref 20–29)
Calcium: 9.1 mg/dL (ref 8.6–10.2)
Chloride: 103 mmol/L (ref 96–106)
Creatinine, Ser: 1.01 mg/dL (ref 0.76–1.27)
GFR calc Af Amer: 85 mL/min/{1.73_m2} (ref >59)
GFR calc non Af Amer: 73 mL/min/{1.73_m2} (ref >59)
Globulin, Total: 2.4 g/dL (ref 1.5–4.5)
Glucose: 94 mg/dL (ref 65–99)
Potassium: 3.6 mmol/L (ref 3.5–5.2)
Sodium: 141 mmol/L (ref 134–144)
Total Protein: 6.7 g/dL (ref 6.0–8.5)

## 2019-06-02 LAB — URINE CULTURE: Organism ID, Bacteria: NO GROWTH

## 2019-06-07 ENCOUNTER — Other Ambulatory Visit: Payer: Self-pay | Admitting: Family Medicine

## 2019-06-07 DIAGNOSIS — E039 Hypothyroidism, unspecified: Secondary | ICD-10-CM

## 2019-06-14 ENCOUNTER — Telehealth: Payer: Self-pay | Admitting: Family Medicine

## 2019-06-14 NOTE — Telephone Encounter (Signed)
He needs a referral to urology.Talk to me about this

## 2019-06-14 NOTE — Telephone Encounter (Signed)
Pt called and states that he is still have traces of blood in his urine every 2 or 3 days he wants to know what he needs to do if he needs to go to urology he would like a call back pt can be reached at [phone]

## 2019-06-15 NOTE — Telephone Encounter (Signed)
Lvm for pt to call back to office. Need to make sure he was called by urology. [place]

## 2019-06-16 DIAGNOSIS — R351 Nocturia: Secondary | ICD-10-CM | POA: Diagnosis not present

## 2019-06-16 DIAGNOSIS — R310 Gross hematuria: Secondary | ICD-10-CM | POA: Diagnosis not present

## 2019-06-16 DIAGNOSIS — R350 Frequency of micturition: Secondary | ICD-10-CM | POA: Diagnosis not present

## 2019-06-28 DIAGNOSIS — I700 Atherosclerosis of aorta: Secondary | ICD-10-CM | POA: Diagnosis not present

## 2019-06-28 DIAGNOSIS — Q630 Accessory kidney: Secondary | ICD-10-CM | POA: Diagnosis not present

## 2019-06-28 DIAGNOSIS — Q625 Duplication of ureter: Secondary | ICD-10-CM | POA: Diagnosis not present

## 2019-06-28 DIAGNOSIS — R310 Gross hematuria: Secondary | ICD-10-CM | POA: Diagnosis not present

## 2019-06-28 DIAGNOSIS — N200 Calculus of kidney: Secondary | ICD-10-CM | POA: Diagnosis not present

## 2019-07-15 DIAGNOSIS — R310 Gross hematuria: Secondary | ICD-10-CM | POA: Diagnosis not present

## 2019-08-31 ENCOUNTER — Telehealth: Payer: Self-pay | Admitting: Internal Medicine

## 2019-08-31 NOTE — Telephone Encounter (Signed)
Pt was notified of 800mg  3 times a day. He is scheduled for Thursday just in case

## 2019-08-31 NOTE — Telephone Encounter (Signed)
Have been taking her milligrams of ibuprofen 3 times per day for the next couple of days.  I can always talk to him or see him Thursday if needed

## 2019-08-31 NOTE — Telephone Encounter (Signed)
Pt called and states that he was carrying some heavy things yesterday and he started having right shoulder upper arm pain and he took ibuprofen with no relieve and then put some biofreeze and that didn't help. He thought it would be gone by this morning but its not. When he moves his arm he has pain. No SOB or no chest pain. He just wants to know what you recommend or should he be concerned

## 2019-09-02 ENCOUNTER — Ambulatory Visit (INDEPENDENT_AMBULATORY_CARE_PROVIDER_SITE_OTHER): Payer: Medicare HMO | Admitting: Family Medicine

## 2019-09-02 ENCOUNTER — Other Ambulatory Visit: Payer: Self-pay

## 2019-09-02 ENCOUNTER — Encounter: Payer: Self-pay | Admitting: Family Medicine

## 2019-09-02 VITALS — BP 136/90 | HR 63 | Temp 96.7°F | Wt 165.8 lb

## 2019-09-02 DIAGNOSIS — M25511 Pain in right shoulder: Secondary | ICD-10-CM

## 2019-09-02 NOTE — Progress Notes (Signed)
   Subjective:    Patient ID: Neil Rivera, male    DOB: 06-Feb-1945, 74 y.o.   MRN: [id number]  HPI He complains of a several day history of right shoulder pain. This occurred after he carried a lot of heavy objects. Prior to that he played ping-pong for the first time a long time and then also shot basketball for a long period of time. He states that now he is 95% better.   Review of Systems     Objective:   Physical Exam Full range of motion of the shoulder. Negative drop arm test. Negative Neer's and Hawkins test.      Assessment & Plan:  Acute pain of right shoulder I explained that this is probably an overuse injury. I used this opportunity to explain to him to start low and go slow in terms of physical activity both aerobically and to help build up strength. He expressed understanding.

## 2019-09-03 ENCOUNTER — Other Ambulatory Visit: Payer: Self-pay | Admitting: Family Medicine

## 2019-09-03 DIAGNOSIS — E039 Hypothyroidism, unspecified: Secondary | ICD-10-CM

## 2019-09-24 ENCOUNTER — Encounter: Payer: Medicare HMO | Admitting: Family Medicine

## 2019-09-26 ENCOUNTER — Other Ambulatory Visit: Payer: Self-pay | Admitting: Family Medicine

## 2019-09-26 DIAGNOSIS — I10 Essential (primary) hypertension: Secondary | ICD-10-CM

## 2019-10-18 ENCOUNTER — Other Ambulatory Visit: Payer: Self-pay | Admitting: Family Medicine

## 2019-10-18 DIAGNOSIS — E785 Hyperlipidemia, unspecified: Secondary | ICD-10-CM

## 2019-10-19 NOTE — Telephone Encounter (Signed)
Has upcoming appointment

## 2019-10-25 ENCOUNTER — Other Ambulatory Visit: Payer: Self-pay

## 2019-10-25 ENCOUNTER — Ambulatory Visit (INDEPENDENT_AMBULATORY_CARE_PROVIDER_SITE_OTHER): Payer: Medicare HMO | Admitting: Family Medicine

## 2019-10-25 ENCOUNTER — Encounter: Payer: Self-pay | Admitting: Family Medicine

## 2019-10-25 VITALS — BP 152/98 | HR 88 | Temp 97.0°F | Ht 67.0 in | Wt 162.6 lb

## 2019-10-25 DIAGNOSIS — Z8249 Family history of ischemic heart disease and other diseases of the circulatory system: Secondary | ICD-10-CM

## 2019-10-25 DIAGNOSIS — Z8612 Personal history of poliomyelitis: Secondary | ICD-10-CM

## 2019-10-25 DIAGNOSIS — E039 Hypothyroidism, unspecified: Secondary | ICD-10-CM | POA: Diagnosis not present

## 2019-10-25 DIAGNOSIS — Z833 Family history of diabetes mellitus: Secondary | ICD-10-CM

## 2019-10-25 DIAGNOSIS — Z86010 Personal history of colonic polyps: Secondary | ICD-10-CM

## 2019-10-25 DIAGNOSIS — E785 Hyperlipidemia, unspecified: Secondary | ICD-10-CM

## 2019-10-25 DIAGNOSIS — I10 Essential (primary) hypertension: Secondary | ICD-10-CM | POA: Diagnosis not present

## 2019-10-25 DIAGNOSIS — Z0000 Encounter for general adult medical examination without abnormal findings: Secondary | ICD-10-CM

## 2019-10-25 DIAGNOSIS — Z23 Encounter for immunization: Secondary | ICD-10-CM

## 2019-10-25 MED ORDER — ATORVASTATIN CALCIUM 20 MG PO TABS: 20.0000 mg | ORAL_TABLET | Freq: Every day | ORAL | 3 refills | Status: DC

## 2019-10-25 NOTE — Progress Notes (Signed)
Neil Rivera is a 74 y.o. male who presents for annual wellness visit,CPE and follow-up on chronic medical conditions.  He has history of hypothyroidism and continues on Synthroid without difficulty.  He is also taking atorvastatin and having no muscle aches or pains.  Does have a family history of heart disease.  He walks regularly. there is also family history of diabetes.  Continues on HCTZ for his blood pressure.  He does have a history of colonic polyps and is scheduled for follow-up colonoscopy next year.  He also has remote history of polio as a child but has not had any residual that he is aware of.  Otherwise his family and social history is unremarkable .He is planning to retire within the next several weeks.   Immunizations and Health Maintenance Immunization History  Administered Date(s) Administered  . DTaP 11/23/2003  . Influenza Split 10/05/2010, 11/08/2011  . Influenza, High Dose Seasonal PF 11/16/2012, 11/28/2016, 02/12/2018  . Pneumococcal Conjugate-13 06/29/2013  . Pneumococcal Polysaccharide-23 05/08/2007  . Zoster 05/08/2007   Health Maintenance Due  Topic Date Due  . COVID-19 Vaccine (1) Never done  . TETANUS/TDAP  Never done  . INFLUENZA VACCINE  08/22/2019    Last colonoscopy: 10/19/15 Last PSA: 11/08/11 Dentist:Q four months   Ophtho: Q Two year Exercise: walking five days a week for thirty min.  Other doctors caring for patient include:  Dr. Cristina [facility]  Advanced Directives: Does Patient Have a Medical Advance Directive?: Yes Type of Advance Directive: Healthcare Power of Attorney, Living will Does patient want to make changes to medical advance directive?: No - Patient declined Copy of [place] in Chart?: No - copy requested  Depression screen:  See questionnaire below.     Depression screen  [facility] 2/9 10/25/2019 09/22/2018 08/20/2017 08/14/2016 07/13/2015  Decreased Interest 0 0 0 0 0  Down, Depressed, Hopeless 0 0 0 0 0  PHQ - 2 Score 0 0  0 0 0    Fall Screen: See Questionaire below.   Fall Risk  10/25/2019 09/22/2018 08/20/2017 08/14/2016 07/13/2015  Falls in the past year? 0 0 No No No  Number falls in past yr: - - - - -  Injury with Fall? - - - - -  Risk for fall due to : No Fall Risks - - - -    ADL screen:  See questionnaire below.  Functional Status Survey: Is the patient deaf or have difficulty hearing?: No Does the patient have difficulty seeing, even when wearing glasses/contacts?: No Does the patient have difficulty concentrating, remembering, or making decisions?: No Does the patient have difficulty walking or climbing stairs?: No Does the patient have difficulty dressing or bathing?: No Does the patient have difficulty doing errands alone such as visiting a doctor's office or shopping?: No   Review of Systems  Constitutional: -, -unexpected weight change, -anorexia, -fatigue Allergy: -sneezing, -itching, -congestion Dermatology: denies changing moles, rash, lumps ENT: -runny nose, -ear pain, -sore throat,  Cardiology:  -chest pain, -palpitations, -orthopnea, Respiratory: -cough, -shortness of breath, -dyspnea on exertion, -wheezing,  Gastroenterology: -abdominal pain, -nausea, -vomiting, -diarrhea, -constipation, -dysphagia Hematology: -bleeding or bruising problems Musculoskeletal: -arthralgias, -myalgias, -joint swelling, -back pain, - Ophthalmology: -vision changes,  Urology: -dysuria, -difficulty urinating,  -urinary frequency, -urgency, incontinence Neurology: -, -numbness, , -memory loss, -falls, -dizziness    PHYSICAL EXAM:  General Appearance: Alert, cooperative, no distress, appears stated age Head: Normocephalic, without obvious abnormality, atraumatic Eyes: PERRL, conjunctiva/corneas clear, EOM's intact,  Ears: Normal  TM's and external ear canals Nose: Nares normal, mucosa normal, no drainage or sinus   tenderness Throat: Lips, mucosa, and tongue normal; teeth and gums normal Neck:  Supple, no lymphadenopathy, thyroid:no enlargement/tenderness/nodules; no carotid bruit or JVD Lungs: Clear to auscultation bilaterally without wheezes, rales or ronchi; respirations unlabored Heart: Regular rate and rhythm, S1 and S2 normal, no murmur, rub or gallop Abdomen: Soft, non-tender, nondistended, normoactive bowel sounds, no masses, no hepatosplenomegaly Extremities: No clubbing, cyanosis or edema Pulses: 2+ and symmetric all extremities Skin: Skin color, texture, turgor normal, no rashes or lesions Lymph nodes: Cervical, supraclavicular, and axillary nodes normal Neurologic: CNII-XII intact, normal strength, sensation and gait; reflexes 2+ and symmetric throughout   Psych: Normal mood, affect, hygiene and grooming  ASSESSMENT/PLAN: Primary hypertension  Hypothyroidism, unspecified type  Family history of heart disease in male family member before age 22  Family history of diabetes mellitus  Hyperlipidemia with target LDL less than 100  History of colonic polyps  History of poliomyelitis  Need for influenza vaccination - Plan: Flu Vaccine QUAD High Dose(Fluad)  Continue on his present medication regimen.  May need to readjust his medication for blood pressure and we will reevaluate that in 1 month.  He is to check his blood pressure weekly.  Discussed retirement with him and discussed the need to take care of intellectual psychological and social issues.  He is to get the Shingrix as well as Tdap at the drugstore.  Continue on Synthroid.   Discussed at least 30 minutes of aerobic activity at least 5 days/week;  Immunization recommendations discussed.  Colonoscopy recommendations reviewed.   Medicare Attestation I have personally reviewed: The patient's medical and social history Their use of alcohol, tobacco or illicit drugs Their current medications and supplements The patient's functional ability including ADLs,fall risks, home safety risks, cognitive, and hearing  and visual impairment Diet and physical activities Evidence for depression or mood disorders  The patient's weight, height, and BMI have been recorded in the chart.  I have made referrals, counseling, and provided education to the patient based on review of the above and I have provided the patient with a written personalized care plan for preventive services.     Jill Alexanders, MD   10/25/2019

## 2019-10-25 NOTE — Patient Instructions (Signed)
  Mr. Neil Rivera , Thank you for taking time to come for your Medicare Wellness Visit. I appreciate your ongoing commitment to your health goals. Please review the following plan we discussed and let me know if I can assist you in the future.   These are the goals we discussed: Intellectual, psychological and social after you retire still needs to be addressed This is a list of the screening recommended for you and due dates:  Health Maintenance  Topic Date Due  . COVID-19 Vaccine (1) Never done  . Tetanus Vaccine  Never done  . Flu Shot  08/22/2019  . Colon Cancer Screening  10/18/2020  .  Hepatitis C: One time screening is recommended by Center for Disease Control  (CDC) for  adults born from [birth year] through 1965.   Completed  . Pneumonia vaccines  Discontinued

## 2019-11-19 ENCOUNTER — Other Ambulatory Visit: Payer: Self-pay

## 2019-11-19 ENCOUNTER — Ambulatory Visit (INDEPENDENT_AMBULATORY_CARE_PROVIDER_SITE_OTHER): Payer: Medicare Other

## 2019-11-19 DIAGNOSIS — Z23 Encounter for immunization: Secondary | ICD-10-CM | POA: Diagnosis not present

## 2019-11-29 ENCOUNTER — Encounter: Payer: Self-pay | Admitting: Family Medicine

## 2019-11-29 ENCOUNTER — Other Ambulatory Visit: Payer: Self-pay

## 2019-11-29 ENCOUNTER — Ambulatory Visit (INDEPENDENT_AMBULATORY_CARE_PROVIDER_SITE_OTHER): Payer: Medicare HMO | Admitting: Family Medicine

## 2019-11-29 VITALS — BP 156/96 | HR 68 | Temp 96.6°F | Wt 164.6 lb

## 2019-11-29 DIAGNOSIS — I10 Essential (primary) hypertension: Secondary | ICD-10-CM | POA: Diagnosis not present

## 2019-11-29 MED ORDER — LOSARTAN POTASSIUM-HCTZ 50-12.5 MG PO TABS: 1.0000 | ORAL_TABLET | Freq: Every day | ORAL | 3 refills | Status: DC

## 2019-11-29 NOTE — Patient Instructions (Signed)
Leave a message on my chart in about a month and we will adjust based on that if we need to

## 2019-11-29 NOTE — Progress Notes (Signed)
   Subjective:    Patient ID: Neil Rivera, male    DOB: 09-20-1945, 74 y.o.   MRN: [id number]  HPI He is here for recheck on his blood pressure.  He did buy a new machine and it was measured against ours and it is accurate.   Review of Systems     Objective:   Physical Exam Alert and in no distress.  Blood pressure compared to ours is recorded.       Assessment & Plan:  Primary hypertension - Plan: losartan-hydrochlorothiazide (HYZAAR) 50-12.5 MG tablet He will stop the HCTZ and switch to this medication.  He is to call me through my chart in about a month and leave a message concerning possible readjustment.  He was comfortable with that.

## 2019-12-25 ENCOUNTER — Other Ambulatory Visit: Payer: Self-pay | Admitting: Family Medicine

## 2019-12-25 DIAGNOSIS — I10 Essential (primary) hypertension: Secondary | ICD-10-CM

## 2019-12-30 ENCOUNTER — Encounter: Payer: Self-pay | Admitting: Family Medicine

## 2020-02-11 DIAGNOSIS — Q149 Congenital malformation of posterior segment of eye, unspecified: Secondary | ICD-10-CM | POA: Diagnosis not present

## 2020-02-11 DIAGNOSIS — H2513 Age-related nuclear cataract, bilateral: Secondary | ICD-10-CM | POA: Diagnosis not present

## 2020-02-11 DIAGNOSIS — H5213 Myopia, bilateral: Secondary | ICD-10-CM | POA: Diagnosis not present

## 2020-02-24 ENCOUNTER — Other Ambulatory Visit: Payer: Self-pay | Admitting: Family Medicine

## 2020-02-24 DIAGNOSIS — E039 Hypothyroidism, unspecified: Secondary | ICD-10-CM

## 2020-02-25 ENCOUNTER — Other Ambulatory Visit: Payer: Self-pay

## 2020-02-25 ENCOUNTER — Ambulatory Visit (INDEPENDENT_AMBULATORY_CARE_PROVIDER_SITE_OTHER): Payer: Medicare HMO | Admitting: Family Medicine

## 2020-02-25 ENCOUNTER — Encounter: Payer: Self-pay | Admitting: Family Medicine

## 2020-02-25 VITALS — BP 140/90 | HR 67 | Temp 97.2°F | Wt 162.2 lb

## 2020-02-25 DIAGNOSIS — M79644 Pain in right finger(s): Secondary | ICD-10-CM

## 2020-02-25 NOTE — Progress Notes (Signed)
   Subjective:    Patient ID: Neil Rivera, male    DOB: 1945-05-01, 75 y.o.   MRN: [id number]  HPI He complains of a several day history of right thumb pain mainly to the lateral aspect of the thumb near the nail.  No history of injury.   Review of Systems     Objective:   Physical Exam  Exam of the right shows no tenderness along the cuticle.  Slight tenderness distal to that on the palmar surface.  Does not feel warm or erythematous.      Assessment & Plan:  Pain of right thumb Recommend heat several times per day to help with this.  Explained that this could be a paronychia and where to look for swelling and redness.  He will call if this gets worse.

## 2020-03-15 DIAGNOSIS — H903 Sensorineural hearing loss, bilateral: Secondary | ICD-10-CM | POA: Diagnosis not present

## 2020-03-15 DIAGNOSIS — H6123 Impacted cerumen, bilateral: Secondary | ICD-10-CM | POA: Diagnosis not present

## 2020-05-10 ENCOUNTER — Other Ambulatory Visit: Payer: Self-pay | Admitting: Family Medicine

## 2020-05-10 DIAGNOSIS — E039 Hypothyroidism, unspecified: Secondary | ICD-10-CM

## 2020-05-31 ENCOUNTER — Ambulatory Visit: Payer: Medicare HMO

## 2020-05-31 DIAGNOSIS — Z23 Encounter for immunization: Secondary | ICD-10-CM

## 2020-06-26 ENCOUNTER — Ambulatory Visit (INDEPENDENT_AMBULATORY_CARE_PROVIDER_SITE_OTHER): Payer: Medicare HMO | Admitting: Family Medicine

## 2020-06-26 ENCOUNTER — Encounter: Payer: Self-pay | Admitting: Family Medicine

## 2020-06-26 ENCOUNTER — Other Ambulatory Visit: Payer: Self-pay

## 2020-06-26 VITALS — BP 134/86 | HR 87 | Temp 96.7°F | Wt 161.6 lb

## 2020-06-26 DIAGNOSIS — L237 Allergic contact dermatitis due to plants, except food: Secondary | ICD-10-CM

## 2020-06-26 NOTE — Progress Notes (Signed)
   Subjective:    Patient ID: Neil Rivera, male    DOB: 08-24-1945, 75 y.o.   MRN: [id number]  HPI He states that roughly 1 week ago he started having difficulty with a rash present on his legs and arms.  He did do some yard work prior to this.  He has been treating it symptomatically with steroids and compresses.   Review of Systems     Objective:   Physical Exam Scattered erythematous some which are linear in nature lesions are noted on his legs and forearms.  Scattered lesions on her lower extremities of erythema.       Assessment & Plan:  Poison ivy dermatitis I discussed the diagnosis of poison ivy with him and spread as well as using soap and water to get it off his skin.  Recommend cool compresses, cortisone cream and Benadryl at night.  Explained that at this point it should be resolving slowly go away over the next week or 2.  He was comfortable with that.

## 2020-06-26 NOTE — Patient Instructions (Signed)
Poison Ivy Dermatitis Poison ivy dermatitis is redness and soreness of the skin caused by chemicals in the leaves of the poison ivy plant. You may have very bad itching, swelling, a rash, and blisters. What are the causes?  Touching a poison ivy plant.  Touching something that has the chemical on it. This may include animals or objects that have come in contact with the plant. What increases the risk?  Going outdoors often in wooded or [place] areas.  Going outdoors without wearing protective clothing, such as closed shoes, long pants, and a long-sleeved shirt. What are the signs or symptoms?  Skin redness.  Very bad itching.  A rash that often includes bumps and blisters. ? The rash usually appears 48 hours after exposure, if you have been exposed before. ? If this is the first time you have been exposed, the rash may not appear until a week after exposure.  Swelling. This may occur if the reaction is very bad. Symptoms usually last for 1-2 weeks. The first time you develop this condition, symptoms may last 3-4 weeks.   How is this treated? This condition may be treated with:  Hydrocortisone cream or calamine lotion to relieve itching.  Oatmeal baths to soothe the skin.  Medicines, such as over-the-counter antihistamine tablets.  Oral steroid medicine for more severe reactions. Follow these instructions at home: Medicines  Take or apply over-the-counter and prescription medicines only as told by your doctor.  Use hydrocortisone cream or calamine lotion as needed to help with itching. General instructions  Do not scratch or rub your skin.  Put a cold, wet cloth (cold compress) on the affected areas or take baths in cool water. This will help with itching.  Avoid hot baths and showers.  Take oatmeal baths as needed. Use colloidal oatmeal. You can get this at a pharmacy or grocery store. Follow the instructions on the package.  While you have the rash, wash your clothes  right after you wear them.  Keep all follow-up visits as told by your health care provider. This is important. How is this prevented?  Know what poison ivy looks like, so you can avoid it. ? This plant has three leaves with flowering branches on a single stem. ? The leaves are glossy. ? The leaves have uneven edges that come to a point at the front.  If you touch poison ivy, wash your skin with soap and water right away. Be sure to wash under your fingernails.  When hiking or camping, wear long pants, a long-sleeved shirt, tall socks, and hiking boots. You can also use a lotion on your skin that helps to prevent contact with poison ivy.  If you think that your clothes or outdoor gear came in contact with poison ivy, rinse them off with a garden hose before you bring them inside your house.  When doing yard work or gardening, wear gloves, long sleeves, long pants, and boots. Wash your garden tools and gloves if they come in contact with poison ivy.  If you think that your pet has come into contact with poison ivy, wash him or her with pet shampoo and water. Make sure to wear gloves while washing your pet.   Contact a doctor if:  You have open sores in the rash area.  You have more redness, swelling, or pain in the rash area.  You have redness that spreads beyond the rash area.  You have fluid, blood, or pus coming from the rash area.  You  have a fever.  You have a rash over a large area of your body.  You have a rash on your eyes, mouth, or genitals.  Your rash does not get better after a few weeks. Get help right away if:  Your face swells or your eyes swell shut.  You have trouble breathing.  You have trouble swallowing. These symptoms may be an emergency. Do not wait to see if the symptoms will go away. Get medical help right away. Call your local emergency services (911 in the [place] Do not drive yourself to the hospital. Summary  Poison ivy dermatitis is redness and  soreness of the skin caused by chemicals in the leaves of the poison ivy plant.  You may have skin redness, very bad itching, swelling, and a rash.  Do not scratch or rub your skin.  Take or apply over-the-counter and prescription medicines only as told by your doctor. This information is not intended to replace advice given to you by your health care provider. Make sure you discuss any questions you have with your health care provider. Document Revised: 05/01/2018 Document Reviewed: 01/02/2018 Elsevier Patient Education  2021 [employer].

## 2020-08-08 ENCOUNTER — Other Ambulatory Visit: Payer: Self-pay | Admitting: Family Medicine

## 2020-08-08 DIAGNOSIS — E039 Hypothyroidism, unspecified: Secondary | ICD-10-CM

## 2020-09-19 DIAGNOSIS — H6122 Impacted cerumen, left ear: Secondary | ICD-10-CM | POA: Diagnosis not present

## 2020-11-06 ENCOUNTER — Other Ambulatory Visit: Payer: Self-pay | Admitting: Family Medicine

## 2020-11-06 DIAGNOSIS — E785 Hyperlipidemia, unspecified: Secondary | ICD-10-CM

## 2020-11-06 DIAGNOSIS — I10 Essential (primary) hypertension: Secondary | ICD-10-CM

## 2020-11-06 DIAGNOSIS — E039 Hypothyroidism, unspecified: Secondary | ICD-10-CM

## 2020-11-07 ENCOUNTER — Ambulatory Visit (INDEPENDENT_AMBULATORY_CARE_PROVIDER_SITE_OTHER): Payer: Medicare HMO | Admitting: Family Medicine

## 2020-11-07 ENCOUNTER — Encounter: Payer: Self-pay | Admitting: Family Medicine

## 2020-11-07 ENCOUNTER — Other Ambulatory Visit: Payer: Self-pay

## 2020-11-07 VITALS — BP 126/80 | HR 60 | Temp 96.9°F | Ht 67.25 in | Wt 162.4 lb

## 2020-11-07 DIAGNOSIS — Z0000 Encounter for general adult medical examination without abnormal findings: Secondary | ICD-10-CM

## 2020-11-07 DIAGNOSIS — Z23 Encounter for immunization: Secondary | ICD-10-CM | POA: Diagnosis not present

## 2020-11-07 DIAGNOSIS — E039 Hypothyroidism, unspecified: Secondary | ICD-10-CM

## 2020-11-07 DIAGNOSIS — Z86010 Personal history of colonic polyps: Secondary | ICD-10-CM | POA: Diagnosis not present

## 2020-11-07 DIAGNOSIS — E785 Hyperlipidemia, unspecified: Secondary | ICD-10-CM | POA: Diagnosis not present

## 2020-11-07 DIAGNOSIS — Z8249 Family history of ischemic heart disease and other diseases of the circulatory system: Secondary | ICD-10-CM | POA: Diagnosis not present

## 2020-11-07 DIAGNOSIS — Z833 Family history of diabetes mellitus: Secondary | ICD-10-CM

## 2020-11-07 DIAGNOSIS — I10 Essential (primary) hypertension: Secondary | ICD-10-CM

## 2020-11-07 MED ORDER — ATORVASTATIN CALCIUM 20 MG PO TABS: 20.0000 mg | ORAL_TABLET | Freq: Every day | ORAL | 3 refills | Status: DC

## 2020-11-07 MED ORDER — LOSARTAN POTASSIUM-HCTZ 50-12.5 MG PO TABS: 1.0000 | ORAL_TABLET | Freq: Every day | ORAL | 3 refills | Status: DC

## 2020-11-07 NOTE — Progress Notes (Signed)
Neil Rivera is a 75 y.o. male who presents for annual wellness visit,CPE and follow-up on chronic medical conditions.  He has no particular complaints.  He does have a history of colonic polyps and is scheduled to be seen again in the near future by the replacement of his previous gastroenterologist.  He continues on his Synthroid and is having no difficulty with that.  He is also taking losartan/HCTZ for his blood pressure.  Continues on Lipitor without aches or pains.  Does have a family history of heart disease and diabetes.  Otherwise he has no particular concerns.   Immunizations and Health Maintenance Immunization History  Administered Date(s) Administered   DTaP 11/23/2003   Fluad Quad(high Dose 65+) 10/25/2019   Influenza Split 10/05/2010, 11/08/2011   Influenza, High Dose Seasonal PF 11/16/2012, 11/28/2016, 02/12/2018   PFIZER(Purple Top)SARS-COV-2 Vaccination 03/12/2019, 04/05/2019, 11/19/2019   Pneumococcal Conjugate-13 06/29/2013   Pneumococcal Polysaccharide-23 05/08/2007   Tdap 02/23/2020   Zoster Recombinat (Shingrix) 08/09/2020   Zoster, Live 05/08/2007   Health Maintenance Due  Topic Date Due   COVID-19 Vaccine (4 - Booster for Pfizer series) 03/20/2020   INFLUENZA VACCINE  08/21/2020   Zoster Vaccines- Shingrix (2 of 2) 10/04/2020   COLONOSCOPY (Pts 45-64yrs Insurance coverage will need to be confirmed)  10/18/2020    Last colonoscopy: 10/19/15 Last PSA: 11/08/2011 level 0.91 Dentist: Q four months Ophtho:Q year Exercise: walking five days a week   Other doctors caring for patient include: Dr.Teoh ENT, Dr. Matilde Rivera urology  Advanced Directives: Does Patient Have a Medical Advance Directive?: No Would patient like information on creating a medical advance directive?: Yes (ED - Information included in AVS)  Depression screen:  See questionnaire below.     Depression screen [facility] 2/9 11/07/2020 10/25/2019 09/22/2018 08/20/2017 08/14/2016  Decreased Interest 0 0 0 0  0  Down, Depressed, Hopeless 0 0 0 0 0  PHQ - 2 Score 0 0 0 0 0    Fall Screen: See Questionaire below.   Fall Risk  11/07/2020 10/25/2019 09/22/2018 08/20/2017 08/14/2016  Falls in the past year? 0 0 0 No No  Number falls in past yr: 0 - - - -  Injury with Fall? 0 - - - -  Risk for fall due to : No Fall Risks No Fall Risks - - -  Follow up Falls evaluation completed - - - -    ADL screen:  See questionnaire below.  Functional Status Survey: Is the patient deaf or have difficulty hearing?: No Does the patient have difficulty seeing, even when wearing glasses/contacts?: No Does the patient have difficulty concentrating, remembering, or making decisions?: No Does the patient have difficulty walking or climbing stairs?: No Does the patient have difficulty dressing or bathing?: No Does the patient have difficulty doing errands alone such as visiting a doctor's office or shopping?: No   Review of Systems  Constitutional: -, -unexpected weight change, -anorexia, -fatigue Allergy: -sneezing, -itching, -congestion Dermatology: denies changing moles, rash, lumps ENT: -runny nose, -ear pain, -sore throat,  Cardiology:  -chest pain, -palpitations, -orthopnea, Respiratory: -cough, -shortness of breath, -dyspnea on exertion, -wheezing,  Gastroenterology: -abdominal pain, -nausea, -vomiting, -diarrhea, -constipation, -dysphagia Hematology: -bleeding or bruising problems Musculoskeletal: -arthralgias, -myalgias, -joint swelling, -back pain, - Ophthalmology: -vision changes,  Urology: -dysuria, -difficulty urinating,  -urinary frequency, -urgency, incontinence Neurology: -, -numbness, , -memory loss, -falls, -dizziness    PHYSICAL EXAM:  General Appearance: Alert, cooperative, no distress, appears stated age Head: Normocephalic, without obvious abnormality, atraumatic  Eyes: PERRL, conjunctiva/corneas clear, EOM's intact, Ears: Normal TM's and external ear canals Nose: Nares normal, mucosa  normal, no drainage or sinus   tenderness Throat: Lips, mucosa, and tongue normal; teeth and gums normal Neck: Supple, no lymphadenopathy, thyroid:no enlargement/tenderness/nodules; no carotid bruit or JVD Lungs: Clear to auscultation bilaterally without wheezes, rales or ronchi; respirations unlabored Heart: Regular rate and rhythm, S1 and S2 normal, no murmur, rub or gallop Abdomen: Soft, non-tender, nondistended, normoactive bowel sounds, no masses, no hepatosplenomegaly Skin: Skin color, texture, turgor normal, no rashes or lesions Lymph nodes: Cervical, supraclavicular, and axillary nodes normal Neurologic: CNII-XII intact, normal strength, sensation and gait; reflexes 2+ and symmetric throughout   Psych: Normal mood, affect, hygiene and grooming EKG read by me shows a rate of 59 with other parameters being normal. ASSESSMENT/PLAN: Routine general medical examination at a health care facility - Plan: CBC with Differential/Platelet, Comprehensive metabolic panel  Primary hypertension - Plan: CBC with Differential/Platelet, Comprehensive metabolic panel  Hypothyroidism, unspecified type - Plan: TSH  Family history of heart disease in male family member before age 32 - Plan: EKG 12-Lead  Family history of diabetes mellitus  Hyperlipidemia with target LDL less than 100 - Plan: Lipid panel  History of colonic polyps  Need for influenza vaccination - Plan: Flu Vaccine QUAD High Dose(Fluad)  Immunization, viral disease - Plan: Ambulance person Booster Discussed heart disease with him. recommended at least 30 minutes of aerobic activity at least 5 days/week; proper sunscreen use reviewed; healthy diet and alcohol recommendations (less than or equal to 2 drinks/day) reviewed; regular seatbelt use; changing batteries in smoke detectors. Immunization recommendations discussed.  Colonoscopy recommendations reviewed.   Medicare Attestation I have personally reviewed: The  patient's medical and social history Their use of alcohol, tobacco or illicit drugs Their current medications and supplements The patient's functional ability including ADLs,fall risks, home safety risks, cognitive, and hearing and visual impairment Diet and physical activities Evidence for depression or mood disorders  The patient's weight, height, and BMI have been recorded in the chart.  I have made referrals, counseling, and provided education to the patient based on review of the above and I have provided the patient with a written personalized care plan for preventive services.     Jill Alexanders, MD   11/07/2020

## 2020-11-08 ENCOUNTER — Encounter: Payer: Self-pay | Admitting: Family Medicine

## 2020-11-08 LAB — COMPREHENSIVE METABOLIC PANEL
ALT: 29 IU/L (ref 0–44)
AST: 23 IU/L (ref 0–40)
Albumin/Globulin Ratio: 1.8 (ref 1.2–2.2)
Albumin: 4.5 g/dL (ref 3.7–4.7)
Alkaline Phosphatase: 102 IU/L (ref 44–121)
BUN/Creatinine Ratio: 19 (ref 10–24)
BUN: 17 mg/dL (ref 8–27)
Bilirubin Total: 0.4 mg/dL (ref 0.0–1.2)
CO2: 23 mmol/L (ref 20–29)
Calcium: 9.4 mg/dL (ref 8.6–10.2)
Chloride: 102 mmol/L (ref 96–106)
Creatinine, Ser: 0.91 mg/dL (ref 0.76–1.27)
Globulin, Total: 2.5 g/dL (ref 1.5–4.5)
Glucose: 84 mg/dL (ref 70–99)
Potassium: 3.9 mmol/L (ref 3.5–5.2)
Sodium: 142 mmol/L (ref 134–144)
Total Protein: 7 g/dL (ref 6.0–8.5)
eGFR: 88 mL/min/{1.73_m2} (ref >59)

## 2020-11-08 LAB — CBC WITH DIFFERENTIAL/PLATELET
Basophils Absolute: 0.1 10*3/uL (ref 0.0–0.2)
Basos: 1 %
EOS (ABSOLUTE): 0.2 10*3/uL (ref 0.0–0.4)
Eos: 3 %
Hematocrit: 45.1 % (ref 37.5–51.0)
Hemoglobin: 15.3 g/dL (ref 13.0–17.7)
Immature Grans (Abs): 0 10*3/uL (ref 0.0–0.1)
Immature Granulocytes: 0 %
Lymphocytes Absolute: 1.4 10*3/uL (ref 0.7–3.1)
Lymphs: 18 %
MCH: 33.2 pg — ABNORMAL HIGH (ref 26.6–33.0)
MCHC: 33.9 g/dL (ref 31.5–35.7)
MCV: 98 fL — ABNORMAL HIGH (ref 79–97)
Monocytes Absolute: 0.7 10*3/uL (ref 0.1–0.9)
Monocytes: 9 %
Neutrophils Absolute: 5.7 10*3/uL (ref 1.4–7.0)
Neutrophils: 69 %
Platelets: 167 10*3/uL (ref 150–450)
RBC: 4.61 x10E6/uL (ref 4.14–5.80)
RDW: 11.9 % (ref 11.6–15.4)
WBC: 8.2 10*3/uL (ref 3.4–10.8)

## 2020-11-08 LAB — LIPID PANEL
Chol/HDL Ratio: 2.8 ratio (ref 0.0–5.0)
Cholesterol, Total: 149 mg/dL (ref 100–199)
HDL: 54 mg/dL (ref >39)
LDL Chol Calc (NIH): 75 mg/dL (ref 0–99)
Triglycerides: 109 mg/dL (ref 0–149)
VLDL Cholesterol Cal: 20 mg/dL (ref 5–40)

## 2020-11-08 LAB — TSH: TSH: 1.51 u[IU]/mL (ref 0.450–4.500)

## 2021-02-04 ENCOUNTER — Other Ambulatory Visit: Payer: Self-pay | Admitting: Family Medicine

## 2021-02-04 DIAGNOSIS — E039 Hypothyroidism, unspecified: Secondary | ICD-10-CM

## 2021-02-05 ENCOUNTER — Other Ambulatory Visit: Payer: Self-pay | Admitting: Family Medicine

## 2021-02-05 DIAGNOSIS — I10 Essential (primary) hypertension: Secondary | ICD-10-CM

## 2021-02-21 DIAGNOSIS — Q149 Congenital malformation of posterior segment of eye, unspecified: Secondary | ICD-10-CM | POA: Diagnosis not present

## 2021-03-08 DIAGNOSIS — D123 Benign neoplasm of transverse colon: Secondary | ICD-10-CM | POA: Diagnosis not present

## 2021-03-08 DIAGNOSIS — K5730 Diverticulosis of large intestine without perforation or abscess without bleeding: Secondary | ICD-10-CM | POA: Diagnosis not present

## 2021-03-08 DIAGNOSIS — Z86010 Personal history of colonic polyps: Secondary | ICD-10-CM | POA: Diagnosis not present

## 2021-03-08 LAB — HM COLONOSCOPY

## 2021-03-09 LAB — HM COLONOSCOPY

## 2021-03-13 DIAGNOSIS — D123 Benign neoplasm of transverse colon: Secondary | ICD-10-CM | POA: Diagnosis not present

## 2021-03-20 DIAGNOSIS — H6123 Impacted cerumen, bilateral: Secondary | ICD-10-CM | POA: Diagnosis not present

## 2021-05-01 DIAGNOSIS — I872 Venous insufficiency (chronic) (peripheral): Secondary | ICD-10-CM | POA: Diagnosis not present

## 2021-05-01 DIAGNOSIS — X32XXXA Exposure to sunlight, initial encounter: Secondary | ICD-10-CM | POA: Diagnosis not present

## 2021-05-01 DIAGNOSIS — Z1283 Encounter for screening for malignant neoplasm of skin: Secondary | ICD-10-CM | POA: Diagnosis not present

## 2021-05-01 DIAGNOSIS — L570 Actinic keratosis: Secondary | ICD-10-CM | POA: Diagnosis not present

## 2021-05-01 DIAGNOSIS — D225 Melanocytic nevi of trunk: Secondary | ICD-10-CM | POA: Diagnosis not present

## 2021-05-05 ENCOUNTER — Other Ambulatory Visit: Payer: Self-pay | Admitting: Family Medicine

## 2021-05-05 DIAGNOSIS — E039 Hypothyroidism, unspecified: Secondary | ICD-10-CM

## 2021-06-05 ENCOUNTER — Encounter: Payer: Self-pay | Admitting: Family Medicine

## 2021-06-05 ENCOUNTER — Ambulatory Visit (INDEPENDENT_AMBULATORY_CARE_PROVIDER_SITE_OTHER): Payer: Medicare HMO | Admitting: Family Medicine

## 2021-06-05 VITALS — BP 120/70 | HR 67 | Temp 96.8°F | Wt 159.8 lb

## 2021-06-05 DIAGNOSIS — I8393 Asymptomatic varicose veins of bilateral lower extremities: Secondary | ICD-10-CM | POA: Diagnosis not present

## 2021-06-05 DIAGNOSIS — M1990 Unspecified osteoarthritis, unspecified site: Secondary | ICD-10-CM | POA: Diagnosis not present

## 2021-06-05 NOTE — Progress Notes (Signed)
? ?  Subjective:  ? ? Patient ID: Neil Rivera, male    DOB: Jun 12, 1945, 76 y.o.   MRN: [id number] ? ?HPI ?He is here for evaluation of multiple issues.  He does note that he can have a slight amount of right knee pain with the first few steps but is able to function after that.  He also notes deviation of the right index finger at the PIP joint with some swelling in that area.  He does not have full flexion of that joint.  He also has venous swelling in both lower extremities but no pain. ? ? ?Review of Systems ? ?   ?Objective:  ? Physical Exam ?Alert and in no distress.  Full motion of the knee without difficulty.  Slight deviation and swelling is noted of the right index finger.  With some limitation of full flexion.  Lower extremity exam does show varicosities. ? ? ? ?   ?Assessment & Plan:  ?Arthritis ? ?Asymptomatic varicose veins of both lower extremities ?I explained that since the knee is only hurting for a very short period of time the only thing I recommend is a good strengthening exercise.  Explained that this is probably arthritic in nature but certainly not enough to do any major intervention.  I explained the same thing for his index finger.  When it starts to interfere with his ability to use the right hand, then further intervention will be needed.  The varicose veins are not of any danger.  Explained that these are not the ones that cause DVT or stroke.  Did recommend using support stockings as much as possible. ? ?

## 2021-08-03 ENCOUNTER — Other Ambulatory Visit: Payer: Self-pay | Admitting: Family Medicine

## 2021-08-03 DIAGNOSIS — E039 Hypothyroidism, unspecified: Secondary | ICD-10-CM

## 2021-09-17 DIAGNOSIS — H6123 Impacted cerumen, bilateral: Secondary | ICD-10-CM | POA: Diagnosis not present

## 2021-09-26 ENCOUNTER — Encounter: Payer: Self-pay | Admitting: Internal Medicine

## 2021-10-17 ENCOUNTER — Encounter: Payer: Self-pay | Admitting: Family Medicine

## 2021-10-17 ENCOUNTER — Ambulatory Visit (INDEPENDENT_AMBULATORY_CARE_PROVIDER_SITE_OTHER): Payer: Medicare HMO | Admitting: Family Medicine

## 2021-10-17 VITALS — BP 130/80 | HR 60 | Temp 96.8°F | Wt 159.4 lb

## 2021-10-17 DIAGNOSIS — R202 Paresthesia of skin: Secondary | ICD-10-CM | POA: Diagnosis not present

## 2021-10-17 DIAGNOSIS — Z23 Encounter for immunization: Secondary | ICD-10-CM

## 2021-10-17 LAB — POCT URINALYSIS DIP (PROADVANTAGE DEVICE)
Bilirubin, UA: NEGATIVE
Blood, UA: NEGATIVE
Glucose, UA: NEGATIVE mg/dL
Ketones, POC UA: NEGATIVE mg/dL
Leukocytes, UA: NEGATIVE
Nitrite, UA: NEGATIVE
Protein Ur, POC: NEGATIVE mg/dL
Specific Gravity, Urine: 1.015
Urobilinogen, Ur: 0.2
pH, UA: 6 (ref 5.0–8.0)

## 2021-10-17 NOTE — Progress Notes (Signed)
   Subjective:    Patient ID: Neil Rivera, male    DOB: 1946-01-11, 76 y.o.   MRN: [id number]  HPI He states that over the last week he has noted a tingling sensation in his penis that is intermittent in nature and it usually only occurs when he thinks about it.  There has been no more urgency, dysuria, hesitancy, decreased stream, abdominal or rectal discomfort.   Review of Systems     Objective:   Physical Exam Genital exam shows no lesions.  Testes are normal.  Rectal exam shows a normal prostate. Urinalysis is negative.      Assessment & Plan:  Tingling sensation  Need for influenza vaccination - Plan: Flu Vaccine QUAD High Dose(Fluad)  At this time I see nothing of any major concern.  Recommend that he watch this over the next week or 2 and to see if any other symptoms occur and if they are still difficulty, we can refer to urology.

## 2021-10-18 ENCOUNTER — Telehealth: Payer: Self-pay | Admitting: Family Medicine

## 2021-10-18 NOTE — Telephone Encounter (Signed)
Left message for patient to call back and schedule Medicare Annual Wellness Visit (AWV) either virtually or in office. Left  my Herbie Drape number [phone]   Last AWV  11/07/20 please schedule with Nurse Health Adviser  I wanted to see if patient would schedule awv prior to appt w/pcp   45 min for awv-i and in office appointments 30 min for awv-s  phone/virtual appointments

## 2021-10-22 DIAGNOSIS — D369 Benign neoplasm, unspecified site: Secondary | ICD-10-CM | POA: Insufficient documentation

## 2021-10-23 ENCOUNTER — Encounter: Payer: Self-pay | Admitting: Family Medicine

## 2021-10-30 ENCOUNTER — Encounter: Payer: Self-pay | Admitting: Internal Medicine

## 2021-11-01 ENCOUNTER — Other Ambulatory Visit: Payer: Self-pay | Admitting: Family Medicine

## 2021-11-01 DIAGNOSIS — E039 Hypothyroidism, unspecified: Secondary | ICD-10-CM

## 2021-11-09 ENCOUNTER — Ambulatory Visit (INDEPENDENT_AMBULATORY_CARE_PROVIDER_SITE_OTHER): Payer: Medicare HMO

## 2021-11-09 VITALS — Ht 69.0 in | Wt 160.0 lb

## 2021-11-09 DIAGNOSIS — Z0000 Encounter for general adult medical examination without abnormal findings: Secondary | ICD-10-CM | POA: Diagnosis not present

## 2021-11-09 NOTE — Patient Instructions (Signed)
Mr. Neil Rivera , Thank you for taking time to come for your Medicare Wellness Visit. I appreciate your ongoing commitment to your health goals. Please review the following plan we discussed and let me know if I can assist you in the future.   Screening recommendations/referrals: Colonoscopy: completed 03/08/2021, due 03/08/2026 Recommended yearly ophthalmology/optometry visit for glaucoma screening and checkup Recommended yearly dental visit for hygiene and checkup  Vaccinations: Influenza vaccine: completed 10/17/2021 Pneumococcal vaccine: completed 06/29/2013 Tdap vaccine: completed 02/23/2020, due 02/22/2030 Shingles vaccine: completed   Covid-19:  11/07/2020, 11/19/2019, 04/05/2019, 03/12/2019  Advanced directives: Please bring a copy of your POA (Power of Attorney) and/or Living Will to your next appointment.   Conditions/risks identified: none  Next appointment: Follow up in one year for your annual wellness visit.   Preventive Care 23 Years and Older, Male Preventive care refers to lifestyle choices and visits with your health care provider that can promote health and wellness. What does preventive care include? A yearly physical exam. This is also called an annual well check. Dental exams once or twice a year. Routine eye exams. Ask your health care provider how often you should have your eyes checked. Personal lifestyle choices, including: Daily care of your teeth and gums. Regular physical activity. Eating a healthy diet. Avoiding tobacco and drug use. Limiting alcohol use. Practicing safe sex. Taking low doses of aspirin every day. Taking vitamin and mineral supplements as recommended by your health care provider. What happens during an annual well check? The services and screenings done by your health care provider during your annual well check will depend on your age, overall health, lifestyle risk factors, and family history of disease. Counseling  Your health care provider may  ask you questions about your: Alcohol use. Tobacco use. Drug use. Emotional well-being. Home and relationship well-being. Sexual activity. Eating habits. History of falls. Memory and ability to understand (cognition). Work and work Statistician. Screening  You may have the following tests or measurements: Height, weight, and BMI. Blood pressure. Lipid and cholesterol levels. These may be checked every 5 years, or more frequently if you are over 26 years old. Skin check. Lung cancer screening. You may have this screening every year starting at age 31 if you have a 30-pack-year history of smoking and currently smoke or have quit within the past 15 years. Fecal occult blood test (FOBT) of the stool. You may have this test every year starting at age 53. Flexible sigmoidoscopy or colonoscopy. You may have a sigmoidoscopy every 5 years or a colonoscopy every 10 years starting at age 45. Prostate cancer screening. Recommendations will vary depending on your family history and other risks. Hepatitis C blood test. Hepatitis B blood test. Sexually transmitted disease (STD) testing. Diabetes screening. This is done by checking your blood sugar (glucose) after you have not eaten for a while (fasting). You may have this done every 1-3 years. Abdominal aortic aneurysm (AAA) screening. You may need this if you are a current or former smoker. Osteoporosis. You may be screened starting at age 24 if you are at high risk. Talk with your health care provider about your test results, treatment options, and if necessary, the need for more tests. Vaccines  Your health care provider may recommend certain vaccines, such as: Influenza vaccine. This is recommended every year. Tetanus, diphtheria, and acellular pertussis (Tdap, Td) vaccine. You may need a Td booster every 10 years. Zoster vaccine. You may need this after age 19. Pneumococcal 13-valent conjugate (PCV13) vaccine. One  dose is recommended after age  21. Pneumococcal polysaccharide (PPSV23) vaccine. One dose is recommended after age 20. Talk to your health care provider about which screenings and vaccines you need and how often you need them. This information is not intended to replace advice given to you by your health care provider. Make sure you discuss any questions you have with your health care provider. Document Released: 02/03/2015 Document Revised: 09/27/2015 Document Reviewed: 11/08/2014 Elsevier Interactive Patient Education  2017 [place] Prevention in the Home Falls can cause injuries. They can happen to people of all ages. There are many things you can do to make your home safe and to help prevent falls. What can I do on the outside of my home? Regularly fix the edges of walkways and driveways and fix any cracks. Remove anything that might make you trip as you walk through a door, such as a raised step or threshold. Trim any bushes or trees on the path to your home. Use bright outdoor lighting. Clear any walking paths of anything that might make someone trip, such as rocks or tools. Regularly check to see if handrails are loose or broken. Make sure that both sides of any steps have handrails. Any raised decks and porches should have guardrails on the edges. Have any leaves, snow, or ice cleared regularly. Use sand or salt on walking paths during winter. Clean up any spills in your garage right away. This includes oil or grease spills. What can I do in the bathroom? Use night lights. Install grab bars by the toilet and in the tub and shower. Do not use towel bars as grab bars. Use non-skid mats or decals in the tub or shower. If you need to sit down in the shower, use a plastic, non-slip stool. Keep the floor dry. Clean up any water that spills on the floor as soon as it happens. Remove soap buildup in the tub or shower regularly. Attach bath mats securely with double-sided non-slip rug tape. Do not have throw  rugs and other things on the floor that can make you trip. What can I do in the bedroom? Use night lights. Make sure that you have a light by your bed that is easy to reach. Do not use any sheets or blankets that are too big for your bed. They should not hang down onto the floor. Have a firm chair that has side arms. You can use this for support while you get dressed. Do not have throw rugs and other things on the floor that can make you trip. What can I do in the kitchen? Clean up any spills right away. Avoid walking on wet floors. Keep items that you use a lot in easy-to-reach places. If you need to reach something above you, use a strong step stool that has a grab bar. Keep electrical cords out of the way. Do not use floor polish or wax that makes floors slippery. If you must use wax, use non-skid floor wax. Do not have throw rugs and other things on the floor that can make you trip. What can I do with my stairs? Do not leave any items on the stairs. Make sure that there are handrails on both sides of the stairs and use them. Fix handrails that are broken or loose. Make sure that handrails are as long as the stairways. Check any carpeting to make sure that it is firmly attached to the stairs. Fix any carpet that is loose or worn.  Avoid having throw rugs at the top or bottom of the stairs. If you do have throw rugs, attach them to the floor with carpet tape. Make sure that you have a light switch at the top of the stairs and the bottom of the stairs. If you do not have them, ask someone to add them for you. What else can I do to help prevent falls? Wear shoes that: Do not have high heels. Have rubber bottoms. Are comfortable and fit you well. Are closed at the toe. Do not wear sandals. If you use a stepladder: Make sure that it is fully opened. Do not climb a closed stepladder. Make sure that both sides of the stepladder are locked into place. Ask someone to hold it for you, if  possible. Clearly mark and make sure that you can see: Any grab bars or handrails. First and last steps. Where the edge of each step is. Use tools that help you move around (mobility aids) if they are needed. These include: Canes. Walkers. Scooters. Crutches. Turn on the lights when you go into a dark area. Replace any light bulbs as soon as they burn out. Set up your furniture so you have a clear path. Avoid moving your furniture around. If any of your floors are uneven, fix them. If there are any pets around you, be aware of where they are. Review your medicines with your doctor. Some medicines can make you feel dizzy. This can increase your chance of falling. Ask your doctor what other things that you can do to help prevent falls. This information is not intended to replace advice given to you by your health care provider. Make sure you discuss any questions you have with your health care provider. Document Released: 11/03/2008 Document Revised: 06/15/2015 Document Reviewed: 02/11/2014 Elsevier Interactive Patient Education  2017 [employer].

## 2021-11-09 NOTE — Progress Notes (Signed)
I connected with Neil Rivera today by telephone and verified that I am speaking with the correct person using two identifiers. Location patient: home Location provider: work Persons participating in the virtual visit: Neil Rivera, Neil Rivera.   I discussed the limitations, risks, security and privacy concerns of performing an evaluation and management service by telephone and the availability of in person appointments. I also discussed with the patient that there may be a patient responsible charge related to this service. The patient expressed understanding and verbally consented to this telephonic visit.    Interactive audio and video telecommunications were attempted between this provider and patient, however failed, due to patient having technical difficulties OR patient did not have access to video capability.  We continued and completed visit with audio only.     Vital signs may be patient reported or missing.  Subjective:   Neil Rivera is a 76 y.o. male who presents for Medicare Annual/Subsequent preventive examination.  Review of Systems     Cardiac Risk Factors include: advanced age (>67mn, >>70women);dyslipidemia;hypertension;male gender     Objective:    Today's Vitals   11/09/21 0911  Weight: 160 lb (72.6 kg)  Height: '5\' 9"'$  (1.753 m)   Body mass index is 23.63 kg/m.     11/09/2021    9:15 AM 11/07/2020    3:07 PM 10/25/2019   11:19 AM 08/20/2017   10:51 AM 08/14/2016    2:01 PM 07/13/2015    9:31 AM 07/06/2014   12:12 PM  Advanced Directives  Does Patient Have a Medical Advance Directive? Yes No Yes Yes No Yes No  Type of AParamedicof [place]onLiving will  [place] will   [place]   Does patient want to make changes to medical advance directive?   No - Patient declined Yes (MAU/Ambulatory/Procedural Areas - Information given)     Copy of [place]  Chart? No - copy requested  No - copy requested   No - copy requested No - copy requested  Would patient like information on creating a medical advance directive?  Yes (ED - Information included in AVS)   Yes (MAU/Ambulatory/Procedural Areas - Information given)  Yes - Educational materials given    Current Medications (verified) Outpatient Encounter Medications as of 11/09/2021  Medication Sig   aspirin 81 MG tablet Take 81 mg by mouth daily.   atorvastatin (LIPITOR) 20 MG tablet Take 1 tablet (20 mg total) by mouth daily.   levothyroxine (SYNTHROID) 100 MCG tablet TAKE 1 TABLET BY MOUTH DAILY   losartan-hydrochlorothiazide (HYZAAR) 50-12.5 MG tablet TAKE ONE TABLET BY MOUTH DAILY   Multiple Vitamins-Minerals (MULTIVITAMIN WITH MINERALS) tablet Take 1 tablet by mouth daily.   No facility-administered encounter medications on file as of 11/09/2021.    Allergies (verified) Patient has no known allergies.   History: Past Medical History:  Diagnosis Date   Allergy    RHINITIS   History of colonic polyps    Hypercholesterolemia    Hypertension    Thyroid disease    HYPOTHYROID   Tubular adenoma of colon 10/19/2015   Past Surgical History:  Procedure Laterality Date   ELBOW SURGERY     FINGER SURGERY     Family History  Problem Relation Age of Onset   Heart disease Father    Diabetes Brother    Diabetes Maternal Uncle    Social History   Socioeconomic History   Marital  status: Married    Spouse name: Not on file   Number of children: Not on file   Years of education: Not on file   Highest education level: Not on file  Occupational History   Occupation: Engineer, maintenance (IT)  Tobacco Use   Smoking status: Never   Smokeless tobacco: Never  Vaping Use   Vaping Use: Never used  Substance and Sexual Activity   Alcohol use: Yes    Comment: rare   Drug use: No   Sexual activity: Yes  Other Topics Concern   Not on file  Social History Narrative   Lives with his wife.   Social  Determinants of Health   Financial Resource Strain: Low Risk  (11/09/2021)   Overall Financial Resource Strain (CARDIA)    Difficulty of Paying Living Expenses: Not hard at all  Food Insecurity: No Food Insecurity (11/09/2021)   Hunger Vital Sign    Worried About Running Out of Food in the Last Year: Never true    Ran Out of Food in the Last Year: Never true  Transportation Needs: No Transportation Needs (11/09/2021)   PRAPARE - Hydrologist (Medical): No    Lack of Transportation (Non-Medical): No  Physical Activity: Sufficiently Active (11/09/2021)   Exercise Vital Sign    Days of Exercise per Week: 5 days    Minutes of Exercise per Session: 50 min  Stress: No Stress Concern Present (11/09/2021)   [place]    Feeling of Stress : Not at all  Social Connections: Not on file    Tobacco Counseling Counseling given: Not Answered   Clinical Intake:  Pre-visit preparation completed: Yes  Pain : No/denies pain     Nutritional Status: BMI of 19-24  Normal Nutritional Risks: None Diabetes: No  How often do you need to have someone help you when you read instructions, pamphlets, or other written materials from your doctor or pharmacy?: 1 - Never What is the last grade level you completed in school?: 53yrgrad school  Diabetic? no  Interpreter Needed?: No  Information entered by :: NAllen Rivera   Activities of Daily Living    11/09/2021    9:16 AM  In your present state of health, do you have any difficulty performing the following activities:  Hearing? 0  Vision? 0  Difficulty concentrating or making decisions? 0  Walking or climbing stairs? 0  Dressing or bathing? 0  Doing errands, shopping? 0  Preparing Food and eating ? N  Using the Toilet? N  In the past six months, have you accidently leaked urine? N  Do you have problems with loss of bowel control? N  Managing your  Medications? N  Managing your Finances? N  Housekeeping or managing your Housekeeping? N    Patient Care Team: LDenita Lung MD as PCP - General (Family Medicine)  Indicate any recent Medical Services you may have received from other than [facility] providers in the past year (date may be approximate).     Assessment:   This is a routine wellness examination for [name].  Hearing/Vision screen [facility] Scre[facility] - [facility]  Dietary issues and exercise activities discussed: Current Exercise Habits: Home exercise routine, Type of exercise: walking, Time (Minutes): 45, Frequency (Times/Week): 5, Weekly Exercise (Minutes/Week): 225   Goals Addressed             This Visit's Progress    Patient Stated  11/09/2021, getting to the gym more       Depression Screen    11/09/2021    9:16 AM 11/07/2020    3:08 PM 10/25/2019   11:19 AM 09/22/2018    2:47 PM 08/20/2017   10:21 AM 08/14/2016    1:47 PM 07/13/2015    9:32 AM  PHQ 2/9 Scores  PHQ - 2 Score 0 0 0 0 0 0 0  PHQ- 9 Score 0          Fall Risk    11/09/2021    9:15 AM 11/07/2020    3:08 PM 10/25/2019   11:19 AM 09/22/2018    2:47 PM 08/20/2017   10:21 AM  [place] in the past year? 0 0 0 0 No  Number falls in past yr: 0 0     Injury with Fall? 0 0     Risk for fall due to : Medication side effect No Fall Risks No Fall Risks    Follow up Falls prevention discussed;Education provided;Falls evaluation completed Falls evaluation completed       FALL RISK PREVENTION PERTAINING TO THE HOME:  Any stairs in or around the home? Yes  If so, are there any without handrails? No  Home free of loose throw rugs in walkways, pet beds, electrical cords, etc? Yes  Adequate lighting in your home to reduce risk of falls? Yes   ASSISTIVE DEVICES UTILIZED TO PREVENT FALLS:  Life alert? No  Use of a cane, walker or w/c? No  Grab bars in the bathroom? Yes  Shower chair or bench in shower?  Yes  Elevated toilet seat or a handicapped toilet? No   TIMED UP AND GO:  Was the test performed? No .       Cognitive Function:        11/09/2021    9:18 AM  6CIT Screen  What Year? 0 points  What month? 0 points  What time? 0 points  Count back from 20 0 points  Months in reverse 0 points  Repeat phrase 2 points  Total Score 2 points    Immunizations Immunization History  Administered Date(s) Administered   DTaP 11/23/2003   Fluad Quad(high Dose 65+) 10/25/2019, 11/07/2020, 10/17/2021   Influenza Split 10/05/2010, 11/08/2011   Influenza, High Dose Seasonal PF 11/16/2012, 11/28/2016, 02/12/2018   PFIZER(Purple Top)SARS-COV-2 Vaccination 03/12/2019, 04/05/2019, 11/19/2019   Pfizer Covid-19 Vaccine Bivalent Booster 21yr & up 11/07/2020   Pneumococcal Conjugate-13 06/29/2013   Pneumococcal Polysaccharide-23 05/08/2007   Tdap 02/23/2020   Zoster Recombinat (Shingrix) 08/09/2020, 10/16/2020   Zoster, Live 05/08/2007    TDAP status: Up to date  Flu Vaccine status: Up to date  Pneumococcal vaccine status: Up to date  Covid-19 vaccine status: Completed vaccines  Qualifies for Shingles Vaccine? Yes   Zostavax completed Yes   Shingrix Completed?: Yes  Screening Tests Health Maintenance  Topic Date Due   COVID-19 Vaccine (5 - Pfizer series) 03/10/2021   Pneumonia Vaccine 75 Years old (3 - PPSV23 or PCV20) 06/19/2028 (Originally 06/30/2014)   COLONOSCOPY (Pts 45-459yrInsurance coverage will need to be confirmed)  03/08/2026   TETANUS/TDAP  02/22/2030   INFLUENZA VACCINE  Completed   Hepatitis C Screening  Completed   Zoster Vaccines- Shingrix  Completed   HPV VACCINES  Aged Out    Health Maintenance  Health Maintenance Due  Topic Date Due   COVID-19 Vaccine (5 - Pfizer series) 03/10/2021    Colorectal cancer screening: Type  of screening: Colonoscopy. Completed 03/08/2021. Repeat every 5 years  Lung Cancer Screening: (Low Dose CT Chest recommended if  Age 57-80 years, 30 pack-year currently smoking OR have quit w/in 15years.) does not qualify.   Lung Cancer Screening Referral: no  Additional Screening:  Hepatitis C Screening: does qualify; Completed 07/13/2015  Vision Screening: Recommended annual ophthalmology exams for early detection of glaucoma and other disorders of the eye. Is the patient up to date with their annual eye exam?  Yes  Who is the provider or what is the name of the office in which the patient attends annual eye exams? [facility] If pt is not established with a provider, would they like to be referred to a provider to establish care? No .   Dental Screening: Recommended annual dental exams for proper oral hygiene  Community Resource Referral / Chronic Care Management: CRR required this visit?  No   CCM required this visit?  No      Plan:     I have personally reviewed and noted the following in the patient's chart:   Medical and social history Use of alcohol, tobacco or illicit drugs  Current medications and supplements including opioid prescriptions. Patient is not currently taking opioid prescriptions. Functional ability and status Nutritional status Physical activity Advanced directives List of other physicians Hospitalizations, surgeries, and ER visits in previous 12 months Vitals Screenings to include cognitive, depression, and falls Referrals and appointments  In addition, I have reviewed and discussed with patient certain preventive protocols, quality metrics, and best practice recommendations. A written personalized care plan for preventive services as well as general preventive health recommendations were provided to patient.     Neil Simmering, Rivera   [id number]   Nurse Notes: none  Due to this being a virtual visit, the after visit summary with patients personalized plan was offered to patient via mail or my-chart.  Patient would like to access on my-chart

## 2021-11-12 ENCOUNTER — Encounter: Payer: Self-pay | Admitting: Internal Medicine

## 2021-11-19 ENCOUNTER — Ambulatory Visit (INDEPENDENT_AMBULATORY_CARE_PROVIDER_SITE_OTHER): Payer: Medicare HMO | Admitting: Family Medicine

## 2021-11-19 VITALS — BP 122/78 | HR 58 | Temp 98.6°F | Ht 68.5 in | Wt 151.0 lb

## 2021-11-19 DIAGNOSIS — E039 Hypothyroidism, unspecified: Secondary | ICD-10-CM | POA: Diagnosis not present

## 2021-11-19 DIAGNOSIS — L729 Follicular cyst of the skin and subcutaneous tissue, unspecified: Secondary | ICD-10-CM

## 2021-11-19 DIAGNOSIS — I10 Essential (primary) hypertension: Secondary | ICD-10-CM

## 2021-11-19 DIAGNOSIS — E785 Hyperlipidemia, unspecified: Secondary | ICD-10-CM

## 2021-11-19 DIAGNOSIS — Z860100 Personal history of colon polyps, unspecified: Secondary | ICD-10-CM

## 2021-11-19 DIAGNOSIS — M1990 Unspecified osteoarthritis, unspecified site: Secondary | ICD-10-CM | POA: Diagnosis not present

## 2021-11-19 DIAGNOSIS — Z0000 Encounter for general adult medical examination without abnormal findings: Secondary | ICD-10-CM

## 2021-11-19 DIAGNOSIS — Z23 Encounter for immunization: Secondary | ICD-10-CM | POA: Diagnosis not present

## 2021-11-19 MED ORDER — LOSARTAN POTASSIUM-HCTZ 50-12.5 MG PO TABS: 1.0000 | ORAL_TABLET | Freq: Every day | ORAL | 3 refills | Status: DC

## 2021-11-19 MED ORDER — ATORVASTATIN CALCIUM 20 MG PO TABS: 20.0000 mg | ORAL_TABLET | Freq: Every day | ORAL | 3 refills | Status: DC

## 2021-11-19 MED ORDER — LEVOTHYROXINE SODIUM 100 MCG PO TABS: 100.0000 ug | ORAL_TABLET | Freq: Every day | ORAL | 3 refills | Status: DC

## 2021-11-19 NOTE — Progress Notes (Signed)
Complete physical exam  Patient: Neil Rivera   DOB: 30-Mar-1945   76 y.o. Male  MRN: [id number]  Subjective:    Chief Complaint  Patient presents with   Annual Exam    CPE only cough and SOB but is getting better, swollen finger in rt. Hand, place on rt. Foot, spot on face, covid shot today     Neil Rivera is a 76 y.o. male who presents today for a complete physical exam. He reports consuming a general diet. Home exercise routine includes walking 1 hrs per day. He generally feels fairly well. He reports sleeping well. He does have some slight arthritic issues with his hands but these are only occasional trouble.  He is also had urethral irritation again but this is only once or twice per month and does not last long.  He usually notes that when he walks.  He does wear briefs when he walks.  He has a colonic polyp and is scheduled for follow-up in 3 years.  He has had the flu shot.  He plans to get RSV at the drugstore.  Would like to get the COVID-vaccine.  He continues on Synthroid and having no difficulty with that.  He is also taking his blood pressure medicine without trouble as well as atorvastatin without aches or pains.  He is now retired and enjoying his retirement.   Most recent fall risk assessment:    11/19/2021    8:21 AM  Fall Risk   Falls in the past year? 0  Number falls in past yr: 0  Injury with Fall? 0  Risk for fall due to : No Fall Risks  Follow up Falls evaluation completed     Most recent depression screenings:    11/19/2021    8:22 AM 11/09/2021    9:16 AM  PHQ 2/9 Scores  PHQ - 2 Score 0 0  PHQ- 9 Score  0        Patient Care Team: Neil Lung, MD as PCP - General (Family Medicine)   Outpatient Medications Prior to Visit  Medication Sig   aspirin 81 MG tablet Take 81 mg by mouth daily.   Multiple Vitamins-Minerals (MULTIVITAMIN WITH MINERALS) tablet Take 1 tablet by mouth daily.   [DISCONTINUED] atorvastatin (LIPITOR) 20 MG tablet Take  1 tablet (20 mg total) by mouth daily.   [DISCONTINUED] levothyroxine (SYNTHROID) 100 MCG tablet TAKE 1 TABLET BY MOUTH DAILY   [DISCONTINUED] losartan-hydrochlorothiazide (HYZAAR) 50-12.5 MG tablet TAKE ONE TABLET BY MOUTH DAILY   No facility-administered medications prior to visit.    Review of Systems  All other systems reviewed and are negative.         Objective:     BP 122/78   Pulse (!) 58   Temp 98.6 F (37 C)   Ht 5' 8.5" (1.74 m)   Wt 151 lb (68.5 kg)   BMI 22.63 kg/m    Physical Exam   Alert and in no distress.  Cystic lesion noted on the left cheek.  Tympanic membranes and canals are normal. Pharyngeal area is normal. Neck is supple without adenopathy or thyromegaly. Cardiac exam shows a regular sinus rhythm without murmurs or gallops. Lungs are clear to auscultation.      Assessment & Plan:    Routine general medical examination at a health care facility - Plan: CBC with Differential/Platelet, Comprehensive metabolic panel, Lipid panel  Arthritis  Primary hypertension - Plan: CBC with Differential/Platelet, Comprehensive metabolic panel, losartan-hydrochlorothiazide (  HYZAAR) 50-12.5 MG tablet  Hypothyroidism, unspecified type - Plan: TSH, levothyroxine (SYNTHROID) 100 MCG tablet  Hyperlipidemia with target LDL less than 100 - Plan: Lipid panel, atorvastatin (LIPITOR) 20 MG tablet  History of colonic polyps  Need for COVID-19 vaccine - Plan: Pfizer Fall 2023 Covid-19 Vaccine 60yr and older  Cyst of skin A small incision was made into the cyst however no material was able to be expressed. He will get the RSV at the drugstore.  Continue on his present medication regimen.  Discussed the arthritis and at this point and recommend symptomatic care.  If he has further difficulty with urethral irritation, I will send him to urology.  He was comfortable with that.  His medications were renewed. Immunization History  Administered Date(s) Administered    COVID-19, mRNA, vaccine(Comirnaty)12 years and older 11/19/2021   DTaP 11/23/2003   Fluad Quad(high Dose 65+) 10/25/2019, 11/07/2020, 10/17/2021   Influenza Split 10/05/2010, 11/08/2011   Influenza, High Dose Seasonal PF 11/16/2012, 11/28/2016, 02/12/2018   PFIZER(Purple Top)SARS-COV-2 Vaccination 03/12/2019, 04/05/2019, 11/19/2019   Pfizer Covid-19 Vaccine Bivalent Booster 131yr& up 11/07/2020   Pneumococcal Conjugate-13 06/29/2013   Pneumococcal Polysaccharide-23 05/08/2007   Tdap 02/23/2020   Zoster Recombinat (Shingrix) 08/09/2020, 10/16/2020   Zoster, Live 05/08/2007    Health Maintenance  Topic Date Due   COVID-19 Vaccine (5 - Pfizer series) 03/10/2021   Pneumonia Vaccine 6536Years old (3 - PPSV23 or PCV20) 06/19/2028 (Originally 06/30/2014)   Medicare Annual Wellness (AWV)  11/10/2022   COLONOSCOPY (Pts 45-4953yrnsurance coverage will need to be confirmed)  03/08/2026   TETANUS/TDAP  02/22/2030   INFLUENZA VACCINE  Completed   Hepatitis C Screening  Completed   Zoster Vaccines- Shingrix  Completed   HPV VACCINES  Aged Out    Discussed health benefits of physical activity, and encouraged him to engage in regular exercise appropriate for his age and condition.  Problem List Items Addressed This Visit     Arthritis   History of colonic polyps   Hyperlipidemia with target LDL less than 100   Relevant Medications   atorvastatin (LIPITOR) 20 MG tablet   losartan-hydrochlorothiazide (HYZAAR) 50-12.5 MG tablet   Other Relevant Orders   Lipid panel   Hypertension   Relevant Medications   atorvastatin (LIPITOR) 20 MG tablet   losartan-hydrochlorothiazide (HYZAAR) 50-12.5 MG tablet   Other Relevant Orders   CBC with Differential/Platelet   Comprehensive metabolic panel   Hypothyroid   Relevant Medications   levothyroxine (SYNTHROID) 100 MCG tablet   Other Relevant Orders   TSH   Other Visit Diagnoses     Routine general medical examination at a health care facility     -  Primary   Relevant Orders   CBC with Differential/Platelet   Comprehensive metabolic panel   Lipid panel   Need for COVID-19 vaccine       Relevant Orders   Pfizer Fall 2023 Covid-19 Vaccine 12y86yrd older (Completed)   Cyst of skin          Recheck 1 year    Neil Alexanders

## 2021-11-20 LAB — COMPREHENSIVE METABOLIC PANEL
ALT: 25 IU/L (ref 0–44)
AST: 20 IU/L (ref 0–40)
Albumin/Globulin Ratio: 1.7 (ref 1.2–2.2)
Albumin: 4.4 g/dL (ref 3.8–4.8)
Alkaline Phosphatase: 92 IU/L (ref 44–121)
BUN/Creatinine Ratio: 17 (ref 10–24)
BUN: 18 mg/dL (ref 8–27)
Bilirubin Total: 1 mg/dL (ref 0.0–1.2)
CO2: 22 mmol/L (ref 20–29)
Calcium: 9.4 mg/dL (ref 8.6–10.2)
Chloride: 103 mmol/L (ref 96–106)
Creatinine, Ser: 1.05 mg/dL (ref 0.76–1.27)
Globulin, Total: 2.6 g/dL (ref 1.5–4.5)
Glucose: 98 mg/dL (ref 70–99)
Potassium: 3.7 mmol/L (ref 3.5–5.2)
Sodium: 142 mmol/L (ref 134–144)
Total Protein: 7 g/dL (ref 6.0–8.5)
eGFR: 74 mL/min/{1.73_m2} (ref >59)

## 2021-11-20 LAB — CBC WITH DIFFERENTIAL/PLATELET
Basophils Absolute: 0.1 10*3/uL (ref 0.0–0.2)
Basos: 1 %
EOS (ABSOLUTE): 0.2 10*3/uL (ref 0.0–0.4)
Eos: 3 %
Hematocrit: 45.5 % (ref 37.5–51.0)
Hemoglobin: 15.6 g/dL (ref 13.0–17.7)
Immature Grans (Abs): 0 10*3/uL (ref 0.0–0.1)
Immature Granulocytes: 0 %
Lymphocytes Absolute: 1.2 10*3/uL (ref 0.7–3.1)
Lymphs: 17 %
MCH: 34.2 pg — ABNORMAL HIGH (ref 26.6–33.0)
MCHC: 34.3 g/dL (ref 31.5–35.7)
MCV: 100 fL — ABNORMAL HIGH (ref 79–97)
Monocytes Absolute: 0.8 10*3/uL (ref 0.1–0.9)
Monocytes: 11 %
Neutrophils Absolute: 4.7 10*3/uL (ref 1.4–7.0)
Neutrophils: 68 %
Platelets: 154 10*3/uL (ref 150–450)
RBC: 4.56 x10E6/uL (ref 4.14–5.80)
RDW: 12.1 % (ref 11.6–15.4)
WBC: 7 10*3/uL (ref 3.4–10.8)

## 2021-11-20 LAB — LIPID PANEL
Chol/HDL Ratio: 2.7 ratio (ref 0.0–5.0)
Cholesterol, Total: 141 mg/dL (ref 100–199)
HDL: 53 mg/dL (ref >39)
LDL Chol Calc (NIH): 70 mg/dL (ref 0–99)
Triglycerides: 94 mg/dL (ref 0–149)
VLDL Cholesterol Cal: 18 mg/dL (ref 5–40)

## 2021-11-20 LAB — TSH: TSH: 1.34 u[IU]/mL (ref 0.450–4.500)

## 2022-03-12 NOTE — Progress Notes (Unsigned)
No chief complaint on file.     CPE with Dr. Redmond School in 10/2021.   Reported walking an hour/day.  Labs from CPE reviewed--normal TSH, CBC (except MCV 100), c-met, lipids Lab Results  Component Value Date   CHOL 141 11/19/2021   HDL 53 11/19/2021   LDLCALC 70 11/19/2021   TRIG 94 11/19/2021   CHOLHDL 2.7 11/19/2021   Last EKG 10/2020, sinus bradycardia, o/w normal.   PMH, PSH, SH reviewed   ROS:   PHYSICAL EXAM:  There were no vitals taken for this visit.  Wt Readings from Last 3 Encounters:  11/19/21 151 lb (68.5 kg)  11/09/21 160 lb (72.6 kg)  10/17/21 159 lb 6.4 oz (72.3 kg)     ASSESSMENT/PLAN:  EKG

## 2022-03-13 ENCOUNTER — Encounter: Payer: Self-pay | Admitting: Family Medicine

## 2022-03-13 ENCOUNTER — Ambulatory Visit (INDEPENDENT_AMBULATORY_CARE_PROVIDER_SITE_OTHER): Payer: Medicare HMO | Admitting: Family Medicine

## 2022-03-13 VITALS — BP 130/80 | HR 72 | Ht 68.5 in | Wt 157.4 lb

## 2022-03-13 DIAGNOSIS — I499 Cardiac arrhythmia, unspecified: Secondary | ICD-10-CM | POA: Diagnosis not present

## 2022-03-13 DIAGNOSIS — R000 Tachycardia, unspecified: Secondary | ICD-10-CM

## 2022-03-18 DIAGNOSIS — H6123 Impacted cerumen, bilateral: Secondary | ICD-10-CM | POA: Diagnosis not present

## 2022-05-15 DIAGNOSIS — H43811 Vitreous degeneration, right eye: Secondary | ICD-10-CM | POA: Diagnosis not present

## 2022-06-06 DIAGNOSIS — H524 Presbyopia: Secondary | ICD-10-CM | POA: Diagnosis not present

## 2022-09-04 DIAGNOSIS — B372 Candidiasis of skin and nail: Secondary | ICD-10-CM | POA: Diagnosis not present

## 2022-09-04 DIAGNOSIS — D225 Melanocytic nevi of trunk: Secondary | ICD-10-CM | POA: Diagnosis not present

## 2022-09-04 DIAGNOSIS — Z1283 Encounter for screening for malignant neoplasm of skin: Secondary | ICD-10-CM | POA: Diagnosis not present

## 2022-09-30 DIAGNOSIS — H903 Sensorineural hearing loss, bilateral: Secondary | ICD-10-CM | POA: Diagnosis not present

## 2022-09-30 DIAGNOSIS — H6123 Impacted cerumen, bilateral: Secondary | ICD-10-CM | POA: Diagnosis not present

## 2022-10-25 ENCOUNTER — Ambulatory Visit (INDEPENDENT_AMBULATORY_CARE_PROVIDER_SITE_OTHER): Payer: Medicare HMO | Admitting: Medical

## 2022-10-25 ENCOUNTER — Ambulatory Visit
Admission: RE | Admit: 2022-10-25 | Discharge: 2022-10-25 | Disposition: A | Discharge status: Home / Self Care | Payer: Medicare HMO | Source: Ambulatory Visit | Attending: Medical | Admitting: Medical

## 2022-10-25 VITALS — BP 120/72 | HR 69 | Wt 156.6 lb

## 2022-10-25 DIAGNOSIS — M19042 Primary osteoarthritis, left hand: Secondary | ICD-10-CM | POA: Diagnosis not present

## 2022-10-25 DIAGNOSIS — M19041 Primary osteoarthritis, right hand: Secondary | ICD-10-CM | POA: Diagnosis not present

## 2022-10-25 DIAGNOSIS — M79641 Pain in right hand: Secondary | ICD-10-CM | POA: Diagnosis not present

## 2022-10-25 DIAGNOSIS — Z23 Encounter for immunization: Secondary | ICD-10-CM

## 2022-10-25 DIAGNOSIS — M19049 Primary osteoarthritis, unspecified hand: Secondary | ICD-10-CM | POA: Diagnosis not present

## 2022-10-25 DIAGNOSIS — M79642 Pain in left hand: Secondary | ICD-10-CM | POA: Diagnosis not present

## 2022-10-25 NOTE — Patient Instructions (Addendum)
Please go to [facility] for your hand xrays.   Their hours are 8am - 4:30 pm Monday - Friday.  Take your insurance card with you.  [facility] [phone]  [address] [place], [place] [address]   There are different types of arthritis/suspect this is osteoarthritis of the hands  Recommendations: You can use topical remedies such as Aspercreme, capsaicin cream, Voltaren gel over-the-counter for relief of discomfort You can use oral medication such as Tylenol for pain, up to 3 times a day if needed You can use occasional anti-inflammatory such as Aleve or ibuprofen but try not to use these every single day due to potential damage to the kidneys Since you have current swelling and can't fully close the right hand, go ahead and use Aleve over the counter once or twice daily for the next 4-6 days.  This can be an acute regimen when worse symptoms like you have now. We will call x-ray results When you see Dr. Susann Givens for well visit soon and labs, ask him about other rheumatoid markers such as uric acid, sed rate, or other potential associated labs depending on your x-ray results   Osteoarthritis  Osteoarthritis is a type of arthritis. It refers to joint pain or joint disease. Osteoarthritis affects tissue that covers the ends of bones in joints (cartilage). Cartilage acts as a cushion between the bones and helps them move smoothly. Osteoarthritis occurs when cartilage in the joints gets worn down. Osteoarthritis is sometimes called "wear and tear" arthritis. Osteoarthritis is the most common form of arthritis. It often occurs in older people. It is a condition that gets worse over time. The joints most often affected by this condition are in the fingers, toes, hips, knees, and spine, including the neck and lower back. What are the causes? This condition is caused by the wearing down of cartilage that covers the ends of bones. What increases the risk? The following factors may  make you more likely to develop this condition: Being age 70 or older. Obesity. Overuse of joints. Past injury of a joint. Past surgery on a joint. Family history of osteoarthritis. What are the signs or symptoms? The main symptoms of this condition are pain, swelling, and stiffness in the joint. Other symptoms may include: An enlarged joint. More pain and further damage caused by small pieces of bone or cartilage that break off and float inside of the joint. Small deposits of bone (osteophytes) that grow on the edges of the joint. A grating or scraping feeling inside the joint when you move it. Popping or creaking sounds when you move. Difficulty walking or exercising. An inability to grip items, twist your hand, or control the movements of your hands and fingers. How is this diagnosed? This condition may be diagnosed based on: Your medical history. A physical exam. Your symptoms. X-rays of the affected joints. Blood tests to rule out other types of arthritis. How is this treated? There is no cure for this condition, but treatment can help control pain and improve joint function. Treatment may include a combination of therapies, such as: Pain relief techniques, such as: Applying heat and cold to the joint. Massage. A form of talk therapy called cognitive behavioral therapy (CBT). This therapy helps you set goals and follow up on the changes that you make. Medicines for pain and inflammation. The medicines can be taken by mouth or applied to the skin. They include: NSAIDs, such as ibuprofen. Prescription medicines. Strong anti-inflammatory medicines (corticosteroids). Certain nutritional supplements.  A prescribed exercise program. You may work with a physical therapist. Assistive devices, such as a brace, wrap, splint, specialized glove, or cane. A weight control plan. Surgery, such as: An osteotomy. This is done to reposition the bones and relieve pain or to remove loose pieces  of bone and cartilage. Joint replacement surgery. You may need this surgery if you have advanced osteoarthritis. Follow these instructions at home: Activity Rest your affected joints as told by your health care provider. Exercise as told by your provider. The provider may recommend specific types of exercise, such as: Strengthening exercises. These are done to strengthen the muscles that support joints affected by arthritis. Aerobic activities. These are exercises, such as brisk walking or water aerobics, that increase your heart rate. Range-of-motion activities. These help your joints move more easily. Balance and agility exercises. Managing pain, stiffness, and swelling     If told, apply heat to the affected area as often as told by your provider. Use the heat source that your provider recommends, such as a moist heat pack or a heating pad. If you have a removable assistive device, remove it as told by your provider. Place a towel between your skin and the heat source. If your provider tells you to keep the assistive device on while you apply heat, place a towel between the assistive device and the heat source. Leave the heat on for 20-30 minutes. If told, put ice on the affected area. If you have a removable assistive device, remove it as told by your provider. Put ice in a plastic bag. Place a towel between your skin and the bag. If your provider tells you to keep the assistive device on during icing, place a towel between the assistive device and the bag. Leave the ice on for 20 minutes, 2-3 times a day. If your skin turns bright red, remove the ice or heat right away to prevent skin damage. The risk of damage is higher if you cannot feel pain, heat, or cold. Move your fingers or toes often to reduce stiffness and swelling. Raise (elevate) the affected area above the level of your heart while you are sitting or lying down. General instructions Take over-the-counter and prescription  medicines only as told by your provider. Maintain a healthy weight. Follow instructions from your provider for weight control. Do not use any products that contain nicotine or tobacco. These products include cigarettes, chewing tobacco, and vaping devices, such as e-cigarettes. If you need help quitting, ask your provider. Use assistive devices as told by your provider. Where to find more information [employer] of Arthritis and Musculoskeletal and Skin Diseases: niams.http://www.myers.net/ [employer] on Aging: BaseRingTones.pl American College of Rheumatology: rheumatology.org Contact a health care provider if: You have redness, swelling, or a feeling of warmth in a joint that gets worse. You have a fever along with joint or muscle aches. You develop a rash. You have trouble doing your normal activities. You have pain that gets worse and is not relieved by pain medicine. This information is not intended to replace advice given to you by your health care provider. Make sure you discuss any questions you have with your health care provider. Document Revised: 09/06/2021 Document Reviewed: 09/06/2021 Elsevier Patient Education  2024 [employer].

## 2022-10-25 NOTE — Progress Notes (Signed)
Subjective:  Neil Rivera is a 77 y.o. 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 Lately he notes both index fingers, and right middle finger more swollen than usual, pain.  Left index finger with a nodule.     No morning stiffness.  There  Pain bothered him the other day doing yard work, picking up limbs.    Has used some ibuprofen here and there.  No other aggravating or relieving factors.    No other c/o.  Past Medical History:  Diagnosis Date   Allergy    RHINITIS   History of colonic polyps    Hypercholesterolemia    Hypertension    Thyroid disease    HYPOTHYROID   Tubular adenoma of colon 10/19/2015   Current Outpatient Medications on File Prior to Visit  Medication Sig Dispense Refill   aspirin 81 MG tablet Take 81 mg by mouth daily.     atorvastatin (LIPITOR) 20 MG tablet Take 1 tablet (20 mg total) by mouth daily. 90 tablet 3   levothyroxine (SYNTHROID) 100 MCG tablet Take 1 tablet (100 mcg total) by mouth daily. 90 tablet 3   losartan-hydrochlorothiazide (HYZAAR) 50-12.5 MG tablet Take 1 tablet by mouth daily. 90 tablet 3   Multiple Vitamins-Minerals (MULTIVITAMIN WITH MINERALS) tablet Take 1 tablet by mouth daily.     No current facility-administered medications on file prior to visit.     The following portions of the patient's history were reviewed and updated as appropriate: allergies, current medications, past family history, past medical history, past social history, past surgical history and problem list.  ROS Otherwise as in subjective above    Objective: BP 120/72   Pulse 69   Wt 156 lb 9.6 oz (71 kg)   BMI 23.46 kg/m   General appearance: alert, no distress, well developed, well nourished     MSK: Somewhat tender right second and  third finger at the PIP, there is some deviation of the index finger on the right at the PIP and swelling of right second and third finger at the PIP.  Bony arthritic changes noted of several joints of both hands and a rubbery nodule of the left index finger at the PIP.  Unable to completely close his right hand at the second and third fingers today due to swelling and pain Pulses: 2+ radial pulses, 2+ pedal pulses, normal cap refill Ext: no edema     Assessment: Encounter Diagnoses  Name Primary?   COVID-19 vaccine administered Yes   Hand arthritis      Plan: Arthritis of hands  We discussed differential but this suggest osteoarthritis.  We discussed possible treatment, he currently has a flareup and cannot fully close the right hand today.  Please go to [facility] for your hand xrays.   Their hours are 8am - 4:30 pm Monday - Friday.  Take your insurance card with you.  [facility] [phone]  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  This can be an acute regimen when worse symptoms like you have now. We will call x-ray results When you see Dr. Susann Givens for well visit soon and labs, ask him about other rheumatoid markers such as uric acid, sed rate, or other potential associated labs depending on your x-ray results  Counseled on the Covid virus vaccine.  Vaccine  information sheet given.  Covid vaccine given after consent obtained.   Fortino was seen today for arthritis.  Diagnoses and all orders for this visit:  COVID-19 vaccine administered -     [employer] Comirnaty Covid -19 Vaccine 12yrs and older -     DG Hand Complete Left; Future -     DG Hand Complete Right; Future  Hand arthritis -     DG Hand Complete Left; Future -     DG Hand Complete Right; Future   Follow up: pending xrays

## 2022-11-11 NOTE — Progress Notes (Signed)
Results sent through MyChart

## 2022-11-19 ENCOUNTER — Ambulatory Visit: Payer: Medicare HMO

## 2022-11-19 DIAGNOSIS — Z0000 Encounter for general adult medical examination without abnormal findings: Secondary | ICD-10-CM

## 2022-11-19 NOTE — Patient Instructions (Signed)
Neil Rivera , Thank you for taking time to come for your Medicare Wellness Visit. I appreciate your ongoing commitment to your health goals. Please review the following plan we discussed and let me know if I can assist you in the future.   Referrals/Orders/Follow-Ups/Clinician Recommendations: none  This is a list of the screening recommended for you and due dates:  Health Maintenance  Topic Date Due   Flu Shot  04/21/2023*   Pneumonia Vaccine (3 of 3 - PPSV23 or PCV20) 06/19/2028*   Medicare Annual Wellness Visit  11/19/2023   Colon Cancer Screening  03/08/2026   DTaP/Tdap/Td vaccine (3 - Td or Tdap) 02/22/2030   COVID-19 Vaccine  Completed   Hepatitis C Screening  Completed   Zoster (Shingles) Vaccine  Completed   HPV Vaccine  Aged Out  *Topic was postponed. The date shown is not the original due date.    Advanced directives: (Copy Requested) Please bring a copy of your health care power of attorney and living will to the office to be added to your chart at your convenience.  Next Medicare Annual Wellness Visit scheduled for next year: Yes  insert Preventive Care attachment Insert FALL PREVENTION attachment if needed

## 2022-11-19 NOTE — Progress Notes (Signed)
Subjective:   Neil Rivera is a 77 y.o. male who presents for Medicare Annual/Subsequent preventive examination.  Visit Complete: Virtual I connected with  Neil Rivera on 11/19/22 by a audio enabled telemedicine application and verified that I am speaking with the correct person using two identifiers.  Patient Location: Home  Provider Location: Office/Clinic  I discussed the limitations of evaluation and management by telemedicine. The patient expressed understanding and agreed to proceed.  Vital Signs: Because this visit was a virtual/telehealth visit, some criteria may be missing or patient reported. Any vitals not documented were not able to be obtained and vitals that have been documented are patient reported.    Cardiac Risk Factors include: advanced age (>39men, >79 women);dyslipidemia;hypertension;male gender     Objective:    Today's Vitals   There is no height or weight on file to calculate BMI.     11/19/2022    8:30 AM 11/09/2021    9:15 AM 11/07/2020    3:07 PM 10/25/2019   11:19 AM 08/20/2017   10:51 AM 08/14/2016    2:01 PM 07/13/2015    9:31 AM  Advanced Directives  Does Patient Have a Medical Advance Directive? Yes Yes No Yes Yes No Yes  Type of Estate agent of [place];Living will Healthcare Power of [place];Living will  Healthcare Power of [place];Living will   Healthcare Power of [place];Living will  Does patient want to make changes to medical advance directive?    No - Patient declined Yes (MAU/Ambulatory/Procedural Areas - Information given)    Copy of Healthcare Power of Attorney in Chart? No - copy requested No - copy requested  No - copy requested   No - copy requested  Would patient like information on creating a medical advance directive?   Yes (ED - Information included in AVS)   Yes (MAU/Ambulatory/Procedural Areas - Information given)     Current Medications (verified) Outpatient Encounter Medications as of  11/19/2022  Medication Sig   aspirin 81 MG tablet Take 81 mg by mouth daily.   atorvastatin (LIPITOR) 20 MG tablet Take 1 tablet (20 mg total) by mouth daily.   levothyroxine (SYNTHROID) 100 MCG tablet Take 1 tablet (100 mcg total) by mouth daily.   losartan-hydrochlorothiazide (HYZAAR) 50-12.5 MG tablet Take 1 tablet by mouth daily.   Multiple Vitamins-Minerals (MULTIVITAMIN WITH MINERALS) tablet Take 1 tablet by mouth daily.   No facility-administered encounter medications on file as of 11/19/2022.    Allergies (verified) Patient has no known allergies.   History: Past Medical History:  Diagnosis Date   Allergy    RHINITIS   History of colonic polyps    Hypercholesterolemia    Hypertension    Thyroid disease    HYPOTHYROID   Tubular adenoma of colon 10/19/2015   Past Surgical History:  Procedure Laterality Date   ELBOW SURGERY     FINGER SURGERY     Family History  Problem Relation Age of Onset   Heart disease Father    Diabetes Brother    Diabetes Maternal Uncle    Social History   Socioeconomic History   Marital status: Married    Spouse name: Not on file   Number of children: Not on file   Years of education: Not on file   Highest education level: Not on file  Occupational History   Occupation: IT trainer  Tobacco Use   Smoking status: Never   Smokeless tobacco: Never  Vaping Use   Vaping status: Never Used  Substance and Sexual Activity   Alcohol use: Not Currently    Comment: rare   Drug use: No   Sexual activity: Yes  Other Topics Concern   Not on file  Social History Narrative   Lives with his wife.   Social Determinants of Health   Financial Resource Strain: Low Risk  (11/19/2022)   Overall Financial Resource Strain (CARDIA)    Difficulty of Paying Living Expenses: Not hard at all  Food Insecurity: No Food Insecurity (11/19/2022)   Hunger Vital Sign    Worried About Running Out of Food in the Last Year: Never true    Ran Out of Food in the Last  Year: Never true  Transportation Needs: No Transportation Needs (11/19/2022)   PRAPARE - Administrator, Civil Service (Medical): No    Lack of Transportation (Non-Medical): No  Physical Activity: Sufficiently Active (11/19/2022)   Exercise Vital Sign    Days of Exercise per Week: 5 days    Minutes of Exercise per Session: 50 min  Stress: No Stress Concern Present (11/19/2022)   [employer] of Occupational Health - Occupational Stress Questionnaire    Feeling of Stress : Not at all  Social Connections: Moderately Integrated (11/19/2022)   Social Connection and Isolation Panel [NHANES]    Frequency of Communication with Friends and Family: More than three times a week    Frequency of Social Gatherings with Friends and Family: More than three times a week    Attends Religious Services: Never    Database administrator or Organizations: Yes    Attends Engineer, structural: More than 4 times per year    Marital Status: Married    Tobacco Counseling Counseling given: Not Answered   Clinical Intake:  Pre-visit preparation completed: Yes  Pain : No/denies pain     Nutritional Risks: None Diabetes: No  How often do you need to have someone help you when you read instructions, pamphlets, or other written materials from your doctor or pharmacy?: 1 - Never  Interpreter Needed?: No  Information entered by :: NAllen LPN   Activities of Daily Living    11/19/2022    8:26 AM  In your present state of health, do you have any difficulty performing the following activities:  Hearing? 0  Vision? 0  Difficulty concentrating or making decisions? 0  Walking or climbing stairs? 0  Dressing or bathing? 0  Doing errands, shopping? 0  Preparing Food and eating ? N  Using the Toilet? N  In the past six months, have you accidently leaked urine? N  Do you have problems with loss of bowel control? N  Managing your Medications? N  Managing your Finances? N   Housekeeping or managing your Housekeeping? N    Patient Care Team: Neil Nian, Neil Rivera as PCP - General (Family Medicine)  Indicate any recent Medical Services you may have received from other than [facility] providers in the past year (date may be approximate).     Assessment:   This is a routine wellness examination for Neil Rivera.  Hearing/Vision screen Hearing Screening - Comments:: Denies hearing issues Vision Screening - Comments:: Regular eye exams, [facility]   Goals Addressed             This Visit's Progress    Patient Stated       11/19/2022, trying to improve posture       Depression Screen    11/19/2022    8:32 AM  11/19/2021    8:22 AM 11/09/2021    9:16 AM 11/07/2020    3:08 PM 10/25/2019   11:19 AM 09/22/2018    2:47 PM 08/20/2017   10:21 AM  PHQ 2/9 Scores  PHQ - 2 Score 0 0 0 0 0 0 0  PHQ- 9 Score 0  0        Fall Risk    11/19/2022    8:31 AM 11/19/2021    8:21 AM 11/09/2021    9:15 AM 11/07/2020    3:08 PM 10/25/2019   11:19 AM  Fall Risk   Falls in the past year? 1 0 0 0 0  Comment tripped      Number falls in past yr: 0 0 0 0   Injury with Fall? 0 0 0 0   Risk for fall due to : Medication side effect No Fall Risks Medication side effect No Fall Risks No Fall Risks  Follow up Falls prevention discussed;Falls evaluation completed Falls evaluation completed Falls prevention discussed;Education provided;Falls evaluation completed Falls evaluation completed     MEDICARE RISK AT HOME: Medicare Risk at Home Any stairs in or around the home?: Yes If so, are there any without handrails?: No Home free of loose throw rugs in walkways, pet beds, electrical cords, etc?: Yes Adequate lighting in your home to reduce risk of falls?: Yes Life alert?: No Use of a cane, walker or w/c?: No Grab bars in the bathroom?: No Shower chair or bench in shower?: No Elevated toilet seat or a handicapped toilet?: Yes  TIMED UP AND GO:  Was the test performed?  No     Cognitive Function:        11/19/2022    8:33 AM 11/09/2021    9:18 AM  6CIT Screen  What Year? 0 points 0 points  What month? 0 points 0 points  What time? 0 points 0 points  Count back from 20 0 points 0 points  Months in reverse 0 points 0 points  Repeat phrase 0 points 2 points  Total Score 0 points 2 points    Immunizations Immunization History  Administered Date(s) Administered   DTaP 11/23/2003   Fluad Quad(high Dose 65+) 10/25/2019, 11/07/2020, 10/17/2021   Influenza Split 10/05/2010, 11/08/2011   Influenza, High Dose Seasonal PF 11/16/2012, 11/28/2016, 02/12/2018   PFIZER(Purple Top)SARS-COV-2 Vaccination 03/12/2019, 04/05/2019, 11/19/2019   Pfizer Covid-19 Vaccine Bivalent Booster 65yrs & up 11/07/2020   Pfizer(Comirnaty)Fall Seasonal Vaccine 12 years and older 11/19/2021, 10/25/2022   Pneumococcal Conjugate-13 06/29/2013   Pneumococcal Polysaccharide-23 05/08/2007   Tdap 02/23/2020   Zoster Recombinant(Shingrix) 08/09/2020, 10/16/2020   Zoster, Live 05/08/2007    TDAP status: Up to date  Flu Vaccine status: Due, Education has been provided regarding the importance of this vaccine. Advised may receive this vaccine at local pharmacy or Health Dept. Aware to provide a copy of the vaccination record if obtained from local pharmacy or Health Dept. Verbalized acceptance and understanding.  Pneumococcal vaccine status: Up to date  Covid-19 vaccine status: Completed vaccines  Qualifies for Shingles Vaccine? Yes   Zostavax completed Yes   Shingrix Completed?: Yes  Screening Tests Health Maintenance  Topic Date Due   INFLUENZA VACCINE  04/21/2023 (Originally 08/22/2022)   Pneumonia Vaccine 37+ Years old (3 of 3 - PPSV23 or PCV20) 06/19/2028 (Originally 06/30/2018)   Medicare Annual Wellness (AWV)  11/19/2023   Colonoscopy  03/08/2026   DTaP/Tdap/Td (3 - Td or Tdap) 02/22/2030   COVID-19 Vaccine  Completed  Hepatitis C Screening  Completed   Zoster Vaccines-  Shingrix  Completed   HPV VACCINES  Aged Out    Health Maintenance  There are no preventive care reminders to display for this patient.   Colorectal cancer screening: Type of screening: Colonoscopy. Completed 03/08/2021. Repeat every 3 years  Lung Cancer Screening: (Low Dose CT Chest recommended if Age 58-80 years, 20 pack-year currently smoking OR have quit w/in 15years.) does not qualify.   Lung Cancer Screening Referral: no  Additional Screening:  Hepatitis C Screening: does qualify; Completed 07/13/2015  Vision Screening: Recommended annual ophthalmology exams for early detection of glaucoma and other disorders of the eye. Is the patient up to date with their annual eye exam?  Yes  Who is the provider or what is the name of the office in which the patient attends annual eye exams? [facility] If pt is not established with a provider, would they like to be referred to a provider to establish care? No .   Dental Screening: Recommended annual dental exams for proper oral hygiene  Diabetic Foot Exam: n/a  Community Resource Referral / Chronic Care Management: CRR required this visit?  No   CCM required this visit?  No     Plan:     I have personally reviewed and noted the following in the patient's chart:   Medical and social history Use of alcohol, tobacco or illicit drugs  Current medications and supplements including opioid prescriptions. Patient is not currently taking opioid prescriptions. Functional ability and status Nutritional status Physical activity Advanced directives List of other physicians Hospitalizations, surgeries, and ER visits in previous 12 months Vitals Screenings to include cognitive, depression, and falls Referrals and appointments  In addition, I have reviewed and discussed with patient certain preventive protocols, quality metrics, and best practice recommendations. A written personalized care plan for preventive services as well as  general preventive health recommendations were provided to patient.     Barb Merino, LPN   [id number]   After Visit Summary: (MyChart) Due to this being a telephonic visit, the after visit summary with patients personalized plan was offered to patient via MyChart   Nurse Notes: none

## 2022-11-26 ENCOUNTER — Other Ambulatory Visit: Payer: Self-pay | Admitting: Family Medicine

## 2022-11-26 DIAGNOSIS — E785 Hyperlipidemia, unspecified: Secondary | ICD-10-CM

## 2022-11-28 ENCOUNTER — Telehealth: Payer: Self-pay | Admitting: Family Medicine

## 2022-11-28 ENCOUNTER — Other Ambulatory Visit: Payer: Self-pay

## 2022-11-28 DIAGNOSIS — E785 Hyperlipidemia, unspecified: Secondary | ICD-10-CM

## 2022-11-28 MED ORDER — ATORVASTATIN CALCIUM 20 MG PO TABS: 20.0000 mg | ORAL_TABLET | Freq: Every day | ORAL | 0 refills | Status: DC

## 2022-11-28 NOTE — Telephone Encounter (Signed)
Pt is calling advising he is out of medications.  I do not see where he was called when med was denied.  Please call patient to verify his concern for refills.  Patients should be called for all denied meds so that we do not receive additional phone calls.

## 2022-11-28 NOTE — Telephone Encounter (Signed)
Called pt, no answer, left voicemail.

## 2022-11-28 NOTE — Telephone Encounter (Signed)
Pt left voicemail that atorvastatin was denied, has appointment coming up and will not have enough medication. Pt does not understand why med was denied he has been taking 20 + years  Please call pr

## 2022-12-03 ENCOUNTER — Ambulatory Visit: Payer: Medicare HMO | Admitting: Family Medicine

## 2022-12-16 ENCOUNTER — Encounter: Payer: Self-pay | Admitting: Family Medicine

## 2022-12-16 ENCOUNTER — Ambulatory Visit (INDEPENDENT_AMBULATORY_CARE_PROVIDER_SITE_OTHER): Payer: Medicare HMO | Admitting: Family Medicine

## 2022-12-16 VITALS — BP 120/70 | HR 58 | Ht 69.0 in | Wt 157.6 lb

## 2022-12-16 DIAGNOSIS — Z0000 Encounter for general adult medical examination without abnormal findings: Secondary | ICD-10-CM

## 2022-12-16 DIAGNOSIS — Z8249 Family history of ischemic heart disease and other diseases of the circulatory system: Secondary | ICD-10-CM

## 2022-12-16 DIAGNOSIS — I8393 Asymptomatic varicose veins of bilateral lower extremities: Secondary | ICD-10-CM

## 2022-12-16 DIAGNOSIS — I10 Essential (primary) hypertension: Secondary | ICD-10-CM | POA: Diagnosis not present

## 2022-12-16 DIAGNOSIS — E785 Hyperlipidemia, unspecified: Secondary | ICD-10-CM

## 2022-12-16 DIAGNOSIS — M1990 Unspecified osteoarthritis, unspecified site: Secondary | ICD-10-CM

## 2022-12-16 DIAGNOSIS — Z860100 Personal history of colon polyps, unspecified: Secondary | ICD-10-CM

## 2022-12-16 DIAGNOSIS — Z833 Family history of diabetes mellitus: Secondary | ICD-10-CM

## 2022-12-16 DIAGNOSIS — E039 Hypothyroidism, unspecified: Secondary | ICD-10-CM

## 2022-12-16 DIAGNOSIS — E038 Other specified hypothyroidism: Secondary | ICD-10-CM | POA: Diagnosis not present

## 2022-12-16 DIAGNOSIS — D369 Benign neoplasm, unspecified site: Secondary | ICD-10-CM

## 2022-12-16 DIAGNOSIS — Z23 Encounter for immunization: Secondary | ICD-10-CM

## 2022-12-16 MED ORDER — LEVOTHYROXINE SODIUM 100 MCG PO TABS: 100.0000 ug | ORAL_TABLET | Freq: Every day | ORAL | 3 refills | Status: DC

## 2022-12-16 MED ORDER — ATORVASTATIN CALCIUM 20 MG PO TABS: 20.0000 mg | ORAL_TABLET | Freq: Every day | ORAL | 3 refills | Status: DC

## 2022-12-16 MED ORDER — LOSARTAN POTASSIUM-HCTZ 50-12.5 MG PO TABS: 1.0000 | ORAL_TABLET | Freq: Every day | ORAL | 3 refills | Status: DC

## 2022-12-16 NOTE — Patient Instructions (Addendum)
Start with Tylenol and you can take as many as to 4 times per day of regular Tylenol and if that does not work then you can use ibuprofen and the dosing for ibuprofen would be as many as 800 mg 3 times per day

## 2022-12-16 NOTE — Progress Notes (Signed)
   Subjective:    Patient ID: Neil Rivera, male    DOB: 1945/05/20, 77 y.o.   MRN: [id number]  HPI He is here for complete examination.  He is having some arthritic type symptoms in his hands and did have recent x-rays which did show some changes.  This is intermittently causing trouble and he does use Tylenol for it.  He tries to keep himself active and continues work on making dietary adjustments.  Continues on Lipitor and having no difficulty with that or thyroid medication.  He is also taking Hyzaar for his blood pressure.  He does have a history of colonic polyps and did have a colonoscopy approximately a year ago.  He is scheduled for repeat in 3 years.   Review of Systems  All other systems reviewed and are negative. Family and social history as well as health maintenance and immunizations was reviewed     Objective:    Physical Exam Alert and in no distress. Tympanic membranes and canals are normal. Pharyngeal area is normal. Neck is supple without adenopathy or thyromegaly. Cardiac exam shows a regular sinus rhythm without murmurs or gallops. Lungs are clear to auscultation.        Assessment & Plan:  Routine general medical examination at a health care facility - Plan: CBC with Differential/Platelet, Comprehensive metabolic panel, Lipid panel  Arthritis  Asymptomatic varicose veins of both lower extremities  Family history of diabetes mellitus  Family history of heart disease in male family member before age 62  History of colonic polyps  Hyperlipidemia with target LDL less than 100 - Plan: Lipid panel, atorvastatin (LIPITOR) 20 MG tablet  Primary hypertension - Plan: CBC with Differential/Platelet, Comprehensive metabolic panel, losartan-hydrochlorothiazide (HYZAAR) 50-12.5 MG tablet  Other specified hypothyroidism - Plan: TSH  Tubular adenoma  Hypothyroidism, unspecified type - Plan: levothyroxine (SYNTHROID) 100 MCG tablet  Need for vaccination against  Streptococcus pneumoniae - Plan: Pneumococcal conjugate vaccine 20-valent (Prevnar 20)  Needs flu shot - Plan: Flu Vaccine Trivalent High Dose (Fluad)  Start with Tylenol and you can take as many as to 4 times per day of regular Tylenol and if that does not work then you can use ibuprofen and the dosing for ibuprofen would be as many as 800 mg 3 times per day he will also continue on his present medication regimen.

## 2022-12-17 LAB — COMPREHENSIVE METABOLIC PANEL
ALT: 34 [IU]/L (ref 0–44)
AST: 29 [IU]/L (ref 0–40)
Albumin: 4.2 g/dL (ref 3.8–4.8)
Alkaline Phosphatase: 94 [IU]/L (ref 44–121)
BUN/Creatinine Ratio: 18 (ref 10–24)
BUN: 17 mg/dL (ref 8–27)
Bilirubin Total: 0.8 mg/dL (ref 0.0–1.2)
CO2: 25 mmol/L (ref 20–29)
Calcium: 9.2 mg/dL (ref 8.6–10.2)
Chloride: 102 mmol/L (ref 96–106)
Creatinine, Ser: 0.96 mg/dL (ref 0.76–1.27)
Globulin, Total: 2.6 g/dL (ref 1.5–4.5)
Glucose: 97 mg/dL (ref 70–99)
Potassium: 3.9 mmol/L (ref 3.5–5.2)
Sodium: 140 mmol/L (ref 134–144)
Total Protein: 6.8 g/dL (ref 6.0–8.5)
eGFR: 82 mL/min/{1.73_m2} (ref >59)

## 2022-12-17 LAB — CBC WITH DIFFERENTIAL/PLATELET
Basophils Absolute: 0.1 10*3/uL (ref 0.0–0.2)
Basos: 1 %
EOS (ABSOLUTE): 0.4 10*3/uL (ref 0.0–0.4)
Eos: 5 %
Hematocrit: 46.5 % (ref 37.5–51.0)
Hemoglobin: 15.6 g/dL (ref 13.0–17.7)
Immature Grans (Abs): 0 10*3/uL (ref 0.0–0.1)
Immature Granulocytes: 0 %
Lymphocytes Absolute: 1.2 10*3/uL (ref 0.7–3.1)
Lymphs: 18 %
MCH: 34.1 pg — ABNORMAL HIGH (ref 26.6–33.0)
MCHC: 33.5 g/dL (ref 31.5–35.7)
MCV: 102 fL — ABNORMAL HIGH (ref 79–97)
Monocytes Absolute: 0.7 10*3/uL (ref 0.1–0.9)
Monocytes: 10 %
Neutrophils Absolute: 4.6 10*3/uL (ref 1.4–7.0)
Neutrophils: 66 %
Platelets: 128 10*3/uL — ABNORMAL LOW (ref 150–450)
RBC: 4.58 x10E6/uL (ref 4.14–5.80)
RDW: 11.9 % (ref 11.6–15.4)
WBC: 7 10*3/uL (ref 3.4–10.8)

## 2022-12-17 LAB — LIPID PANEL
Chol/HDL Ratio: 2.3 {ratio} (ref 0.0–5.0)
Cholesterol, Total: 131 mg/dL (ref 100–199)
HDL: 57 mg/dL (ref >39)
LDL Chol Calc (NIH): 56 mg/dL (ref 0–99)
Triglycerides: 96 mg/dL (ref 0–149)
VLDL Cholesterol Cal: 18 mg/dL (ref 5–40)

## 2022-12-17 LAB — TSH: TSH: 1.26 u[IU]/mL (ref 0.450–4.500)

## 2023-01-07 ENCOUNTER — Encounter: Payer: Self-pay | Admitting: *Deleted

## 2023-03-18 ENCOUNTER — Encounter: Payer: Self-pay | Admitting: Internal Medicine

## 2023-04-10 ENCOUNTER — Telehealth (INDEPENDENT_AMBULATORY_CARE_PROVIDER_SITE_OTHER): Payer: Self-pay | Admitting: Otolaryngology

## 2023-04-10 NOTE — Telephone Encounter (Signed)
 LVM to confirm appt & location [id number]

## 2023-04-11 ENCOUNTER — Ambulatory Visit (INDEPENDENT_AMBULATORY_CARE_PROVIDER_SITE_OTHER): Payer: Medicare Other | Admitting: Otolaryngology

## 2023-04-11 ENCOUNTER — Encounter (INDEPENDENT_AMBULATORY_CARE_PROVIDER_SITE_OTHER): Payer: Self-pay

## 2023-04-11 VITALS — BP 143/84 | HR 56 | Ht 69.0 in | Wt 160.0 lb

## 2023-04-11 DIAGNOSIS — H6123 Impacted cerumen, bilateral: Secondary | ICD-10-CM | POA: Diagnosis not present

## 2023-04-11 NOTE — Progress Notes (Signed)
 Patient ID: Neil Rivera, male   DOB: 01-03-1946, 78 y.o.   MRN: [id number]  Follow-up: Recurrent cerumen impaction  Procedure: Bilateral cerumen disimpaction.   Indication: Cerumen impaction, resulting in ear discomfort and conductive hearing loss.   Description: The patient is placed supine on the operating table. Under the operating microscope, the right ear canal is examined and is noted to be impacted with cerumen. The cerumen is carefully removed with a combination of suction catheters, cerumen curette, and alligator forceps. After the cerumen removal, the ear canal and tympanic membrane are noted to be normal. No middle ear effusion is noted. The same procedure is then repeated on the left side without exception. The patient tolerated the procedure well.  Follow-up care:  The patient is instructed not to use Q-tips to clean the ear canals. The patient will follow up in 6 months.

## 2023-04-16 ENCOUNTER — Ambulatory Visit (INDEPENDENT_AMBULATORY_CARE_PROVIDER_SITE_OTHER): Admitting: Medical

## 2023-04-16 VITALS — BP 126/72 | HR 85 | Wt 156.8 lb

## 2023-04-16 DIAGNOSIS — S60419A Abrasion of unspecified finger, initial encounter: Secondary | ICD-10-CM | POA: Diagnosis not present

## 2023-04-16 NOTE — Progress Notes (Signed)
 Subjective:  Neil Rivera is a 78 y.o. male who presents for Chief Complaint  Patient presents with   painful area around fingernail    Painful area around fingernail x 1 week. Seems to be getting better      Here for finger concern.  Last week about a week ago he was cleaning out a shed including moving some firewood and he thinks he got poked with something along the nailbed of the right thumb.  Last week it hurt more and was red.  It was even red 2 days ago but today is actually much better.  He is using Neosporin.  He still wanted it checked out today.  No current pus no current redness and pain is significantly improving in the last 2 days.  No other aggravating or relieving factors.    No other c/o.    The following portions of the patient's history were reviewed and updated as appropriate: allergies, current medications, past family history, past medical history, past social history, past surgical history and problem list.  ROS Otherwise as in subjective above  Objective: BP 126/72   Pulse 85   Wt 156 lb 12.8 oz (71.1 kg)   BMI 23.16 kg/m   General appearance: alert, no distress, well developed, well nourished Right thumb lateral nail bed with a small 1 mm x 2 mm area of pinkish coloration suggesting recent superficial abrasion or puncture No obvious foreign body Only slightly tender with direct pressure but otherwise no redness no swelling no pus no induration no fluctuance   Assessment: Encounter Diagnosis  Name Primary?   Abrasion of finger, initial encounter Yes     Plan: We discussed the findings.  There is only slight tenderness and no obvious signs of infection, no paronychia.  I advised I cannot 100% guarantee no foreign body but based on the appearance and the course of symptoms probably not likely to be a foreign body under the skin.  I gave him the option of exploration and local anesthesia.  He declines for now.  Advised he keep it clean with soap and  water.  Use Neosporin as he has been doing twice a day for the next 3 to 4 days  Symptoms should gradually resolve over the course of this week  If any changes or new concerns and recheck  Tetanus vaccine is up to date  Neil Rivera was seen today for painful area around fingernail.  Diagnoses and all orders for this visit:  Abrasion of finger, initial encounter    Follow up as needed

## 2023-06-24 DIAGNOSIS — I872 Venous insufficiency (chronic) (peripheral): Secondary | ICD-10-CM | POA: Diagnosis not present

## 2023-06-24 DIAGNOSIS — I83893 Varicose veins of bilateral lower extremities with other complications: Secondary | ICD-10-CM | POA: Diagnosis not present

## 2023-06-24 DIAGNOSIS — L299 Pruritus, unspecified: Secondary | ICD-10-CM | POA: Diagnosis not present

## 2023-06-25 ENCOUNTER — Encounter: Payer: Self-pay | Admitting: Family Medicine

## 2023-06-26 ENCOUNTER — Ambulatory Visit: Payer: Self-pay | Admitting: Family Medicine

## 2023-07-01 DIAGNOSIS — Q149 Congenital malformation of posterior segment of eye, unspecified: Secondary | ICD-10-CM | POA: Diagnosis not present

## 2023-09-09 DIAGNOSIS — L821 Other seborrheic keratosis: Secondary | ICD-10-CM | POA: Diagnosis not present

## 2023-09-09 DIAGNOSIS — D225 Melanocytic nevi of trunk: Secondary | ICD-10-CM | POA: Diagnosis not present

## 2023-09-09 DIAGNOSIS — Z1283 Encounter for screening for malignant neoplasm of skin: Secondary | ICD-10-CM | POA: Diagnosis not present

## 2023-09-25 DIAGNOSIS — L299 Pruritus, unspecified: Secondary | ICD-10-CM | POA: Diagnosis not present

## 2023-09-25 DIAGNOSIS — I872 Venous insufficiency (chronic) (peripheral): Secondary | ICD-10-CM | POA: Diagnosis not present

## 2023-10-16 ENCOUNTER — Ambulatory Visit (INDEPENDENT_AMBULATORY_CARE_PROVIDER_SITE_OTHER): Admitting: Otolaryngology

## 2023-10-16 ENCOUNTER — Encounter (INDEPENDENT_AMBULATORY_CARE_PROVIDER_SITE_OTHER): Payer: Self-pay | Admitting: Otolaryngology

## 2023-10-16 VITALS — BP 117/71 | HR 60 | Temp 97.7°F

## 2023-10-16 DIAGNOSIS — H6123 Impacted cerumen, bilateral: Secondary | ICD-10-CM

## 2023-10-16 NOTE — Progress Notes (Signed)
 Patient ID: Neil Rivera, male   DOB: 15-Jun-1945, 78 y.o.   MRN: [id number]  Follow-up: Recurrent cerumen impaction  Procedure: Bilateral cerumen disimpaction.   Indication: Cerumen impaction, resulting in ear discomfort and conductive hearing loss.   Description: The patient is placed supine on the operating table. Under the operating microscope, the right ear canal is examined and is noted to be impacted with cerumen. The cerumen is carefully removed with a combination of suction catheters, cerumen curette, and alligator forceps. After the cerumen removal, the ear canal and tympanic membrane are noted to be normal. No middle ear effusion is noted. The same procedure is then repeated on the left side without exception. The patient tolerated the procedure well.  Follow-up care:  The patient is instructed not to use Q-tips to clean the ear canals. The patient will follow up in 6 months.

## 2023-12-02 ENCOUNTER — Ambulatory Visit (INDEPENDENT_AMBULATORY_CARE_PROVIDER_SITE_OTHER): Payer: Medicare HMO

## 2023-12-02 DIAGNOSIS — Z0000 Encounter for general adult medical examination without abnormal findings: Secondary | ICD-10-CM

## 2023-12-02 NOTE — Progress Notes (Signed)
 Subjective:   Neil Rivera is a 78 y.o. male who presents for a Medicare Annual Wellness Visit.  Allergies (verified) Patient has no known allergies.   History: Past Medical History:  Diagnosis Date   Allergy    RHINITIS   History of colonic polyps    Hypercholesterolemia    Hypertension    Thyroid  disease    HYPOTHYROID   Tubular adenoma of colon 10/19/2015   Past Surgical History:  Procedure Laterality Date   ELBOW SURGERY     FINGER SURGERY     Family History  Problem Relation Age of Onset   Heart disease Father    Diabetes Brother    Diabetes Maternal Uncle    Social History   Occupational History   Occupation: IT TRAINER  Tobacco Use   Smoking status: Never   Smokeless tobacco: Never  Vaping Use   Vaping status: Never Used  Substance and Sexual Activity   Alcohol use: Not Currently    Comment: rare   Drug use: No   Sexual activity: Yes   Tobacco Counseling Counseling given: Not Answered  SDOH Screenings   Food Insecurity: No Food Insecurity (12/02/2023)  Housing: Low Risk  (12/02/2023)  Transportation Needs: No Transportation Needs (12/02/2023)  Utilities: Not At Risk (12/02/2023)  Alcohol Screen: Low Risk  (12/02/2023)  Depression (PHQ2-9): Low Risk  (12/02/2023)  Financial Resource Strain: Low Risk  (12/02/2023)  Physical Activity: Insufficiently Active (12/02/2023)  Social Connections: Socially Integrated (12/02/2023)  Stress: No Stress Concern Present (12/02/2023)  Tobacco Use: Low Risk  (12/02/2023)  Health Literacy: Adequate Health Literacy (12/02/2023)   Depression Screen    12/02/2023    8:04 AM 11/19/2022    8:32 AM 11/19/2021    8:22 AM 11/09/2021    9:16 AM 11/07/2020    3:08 PM 10/25/2019   11:19 AM 09/22/2018    2:47 PM  PHQ 2/9 Scores  PHQ - 2 Score 0 0 0 0 0 0 0  PHQ- 9 Score 0 0   0         Data saved with a previous flowsheet row definition     Goals Addressed             This Visit's Progress    Patient Stated        12/02/2023, wants to improve posture       Visit info / Clinical Intake: Medicare Wellness Visit Type:: Subsequent Annual Wellness Visit Persons participating in visit:: patient Medicare Wellness Visit Mode:: Telephone If telephone:: video error Because this visit was a virtual/telehealth visit:: unable to obtan vitals due to lack of equipment If Telephone or Video please confirm:: I connected with the patient using audio enabled telemedicine application and verified that I am speaking with the correct person using two identifiers; I discussed the limitations of evaluation and management by telemedicine; The patient expressed understanding and agreed to proceed Patient Location:: home Provider Location:: office Information given by:: patient Interpreter Needed?: No Pre-visit prep was completed: yes AWV questionnaire completed by patient prior to visit?: yes Living arrangements:: lives with spouse/significant other Patient's Overall Health Status Rating: excellent Typical amount of pain: none Does pain affect daily life?: no Are you currently prescribed opioids?: no  Dietary Habits and Nutritional Risks How many meals a day?: 2 (light lunch) Eats fruit and vegetables daily?: yes Most meals are obtained by: preparing own meals; eating out Diabetic:: no  Functional Status Activities of Daily Living (to include ambulation/medication): Independent Ambulation: Independent Medication  Administration: Independent Home Management: Independent Manage your own finances?: yes Primary transportation is: driving Concerns about vision?: no *vision screening is required for WTM* Concerns about hearing?: no  Fall Screening Falls in the past year?: 1 (getting out of low chair) Number of falls in past year: 0 Was there an injury with Fall?: 0 Fall Risk Category Calculator: 1 Patient Fall Risk Level: Low Fall Risk  Fall Risk Patient at Risk for Falls Due to: Medication side  effect Fall risk Follow up: Falls prevention discussed; Falls evaluation completed  Home and Transportation Safety: All rugs have non-skid backing?: N/A, no rugs All stairs or steps have railings?: yes Grab bars in the bathtub or shower?: (!) no Have non-skid surface in bathtub or shower?: (!) no Good home lighting?: yes Regular seat belt use?: yes Hospital stays in the last year:: no  Cognitive Assessment Difficulty concentrating, remembering, or making decisions? : no Will 6CIT or Mini Cog be Completed: yes What year is it?: 0 points What month is it?: 0 points Give patient an address phrase to remember (5 components): [address] [facility] About what time is it?: 0 points Count backwards from 20 to 1: 0 points Say the months of the year in reverse: 0 points Repeat the address phrase from earlier: 0 points 6 CIT Score: 0 points  Advance Directives (For Healthcare) Does Patient Have a Medical Advance Directive?: Yes Type of Advance Directive: Healthcare Power of [place]; Living will Copy of Healthcare Power of Attorney in Chart?: No - copy requested Copy of Living Will in Chart?: No - copy requested  Reviewed/Updated  Reviewed/Updated: Reviewed All (Medical, Surgical, Family, Medications, Allergies, Care Teams, Patient Goals)        Objective:    Today's Vitals   There is no height or weight on file to calculate BMI.  Current Medications (verified) Outpatient Encounter Medications as of 12/02/2023  Medication Sig   aspirin 81 MG tablet Take 81 mg by mouth daily.   atorvastatin  (LIPITOR) 20 MG tablet Take 1 tablet (20 mg total) by mouth daily.   levothyroxine  (SYNTHROID ) 100 MCG tablet Take 1 tablet (100 mcg total) by mouth daily.   losartan -hydrochlorothiazide  (HYZAAR) 50-12.5 MG tablet Take 1 tablet by mouth daily.   Multiple Vitamins-Minerals (MULTIVITAMIN WITH MINERALS) tablet Take 1 tablet by mouth daily.   No facility-administered encounter medications on file  as of 12/02/2023.   Hearing/Vision screen Hearing Screening - Comments:: Denies hearing issues Vision Screening - Comments:: Regular eye exams, Dr. [place] Immunizations and Health Maintenance Health Maintenance  Topic Date Due   Influenza Vaccine  08/22/2023   COVID-19 Vaccine (7 - 2025-26 season) 09/22/2023   Medicare Annual Wellness (AWV)  12/01/2024   Colonoscopy  03/09/2026   DTaP/Tdap/Td (3 - Td or Tdap) 02/22/2030   Pneumococcal Vaccine: 50+ Years  Completed   Hepatitis C Screening  Completed   Zoster Vaccines- Shingrix  Completed   Meningococcal B Vaccine  Aged Out        Assessment/Plan:  This is a routine wellness examination for T[place].  Patient Care Team: Joyce Norleen BROCKS, MD as PCP - General (Family Medicine) Gastroenterology, [facility] [place], [place], [place] (Optometry) Karis Clunes, MD as Consulting Physician (Otolaryngology)  I have personally reviewed and noted the following in the patient's chart:   Medical and social history Use of alcohol, tobacco or illicit drugs  Current medications and supplements including opioid prescriptions. Functional ability and status Nutritional status Physical activity Advanced directives List of other physicians  Hospitalizations, surgeries, and ER visits in previous 12 months Vitals Screenings to include cognitive, depression, and falls Referrals and appointments  No orders of the defined types were placed in this encounter.  In addition, I have reviewed and discussed with patient certain preventive protocols, quality metrics, and best practice recommendations. A written personalized care plan for preventive services as well as general preventive health recommendations were provided to patient.   Ardella FORBES Dawn, LPN   [id number]/88/[id number]   Return in 1 year (on 12/01/2024).  After Visit Summary: (MyChart) Due to this being a telephonic visit, the after visit summary with patients personalized plan was offered to patient via MyChart   Nurse  Notes: none

## 2023-12-02 NOTE — Patient Instructions (Signed)
 Neil Rivera,  Thank you for taking the time for your Medicare Wellness Visit. I appreciate your continued commitment to your health goals. Please review the care plan we discussed, and feel free to reach out if I can assist you further.  Please note that Annual Wellness Visits do not include a physical exam. Some assessments may be limited, especially if the visit was conducted virtually. If needed, we may recommend an in-person follow-up with your provider.  Ongoing Care Seeing your primary care provider every 3 to 6 months helps us  monitor your health and provide consistent, personalized care.   Referrals If a referral was made during today's visit and you haven't received any updates within two weeks, please contact the referred provider directly to check on the status.  Recommended Screenings:  Health Maintenance  Topic Date Due   Flu Shot  08/22/2023   COVID-19 Vaccine (7 - 2025-26 season) 09/22/2023   Medicare Annual Wellness Visit  11/19/2023   Colon Cancer Screening  03/09/2026   DTaP/Tdap/Td vaccine (3 - Td or Tdap) 02/22/2030   Pneumococcal Vaccine for age over 45  Completed   Hepatitis C Screening  Completed   Zoster (Shingles) Vaccine  Completed   Meningitis B Vaccine  Aged Out       12/02/2023    8:07 AM  Advanced Directives  Does Patient Have a Medical Advance Directive? Yes  Type of Estate Agent of [place];Living will  Copy of Healthcare Power of Attorney in Chart? No - copy requested    Vision: Annual vision screenings are recommended for early detection of glaucoma, cataracts, and diabetic retinopathy. These exams can also reveal signs of chronic conditions such as diabetes and high blood pressure.  Dental: Annual dental screenings help detect early signs of oral cancer, gum disease, and other conditions linked to overall health, including heart disease and diabetes.  Please see the attached documents for additional preventive care  recommendations.

## 2023-12-23 ENCOUNTER — Ambulatory Visit: Payer: Medicare HMO | Admitting: Family Medicine

## 2023-12-23 ENCOUNTER — Encounter: Payer: Self-pay | Admitting: Family Medicine

## 2023-12-23 VITALS — BP 130/80 | HR 60 | Ht 68.0 in | Wt 154.6 lb

## 2023-12-23 DIAGNOSIS — E785 Hyperlipidemia, unspecified: Secondary | ICD-10-CM | POA: Diagnosis not present

## 2023-12-23 DIAGNOSIS — Z125 Encounter for screening for malignant neoplasm of prostate: Secondary | ICD-10-CM

## 2023-12-23 DIAGNOSIS — Z131 Encounter for screening for diabetes mellitus: Secondary | ICD-10-CM

## 2023-12-23 DIAGNOSIS — Z23 Encounter for immunization: Secondary | ICD-10-CM | POA: Diagnosis not present

## 2023-12-23 DIAGNOSIS — E039 Hypothyroidism, unspecified: Secondary | ICD-10-CM

## 2023-12-23 DIAGNOSIS — E038 Other specified hypothyroidism: Secondary | ICD-10-CM | POA: Diagnosis not present

## 2023-12-23 DIAGNOSIS — I10 Essential (primary) hypertension: Secondary | ICD-10-CM | POA: Diagnosis not present

## 2023-12-23 DIAGNOSIS — Z0000 Encounter for general adult medical examination without abnormal findings: Secondary | ICD-10-CM | POA: Diagnosis not present

## 2023-12-23 LAB — POCT GLYCOSYLATED HEMOGLOBIN (HGB A1C): Hemoglobin A1C: 5.6 % (ref 4.0–5.6)

## 2023-12-23 LAB — LIPID PANEL

## 2023-12-23 NOTE — Progress Notes (Signed)
 Name: Neil Rivera   Date of Visit: 12/23/23   Date of last visit with me: Visit date not found   CHIEF COMPLAINT:  Chief Complaint  Patient presents with   Annual Exam    Cpe.        HPI:  Discussed the use of AI scribe software for clinical note transcription with the patient, who gave verbal consent to proceed.  History of Present Illness   Neil Rivera is a 78 year old male who presents for routine follow-up and blood work.  He has been experiencing a tingling sensation in the anus for the past week. The sensation is more noticeable when sitting or using the bathroom, but it is not constant and does not worsen with specific activities. He recalls a similar sensation in the past that resolved after discussing it with his doctor. No increased sensation, pain, or urination issues are associated with the tingling.  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 Despite this, he maintains an active lifestyle by walking two miles daily and occasionally going to the gym for strength training.  He has a family history of diabetes and heart disease, with his father having died of a heart attack at 56. He had an EKG a year ago due to stress-related elevated heart rate.  He has experienced burning sensations in his ankles, but a vein specialist found no issues. He had a colonoscopy in 2023, which found benign polyps.         OBJECTIVE:       12/23/2023    1:31 PM  Depression screen PHQ 2/9  Decreased Interest 0  Down, Depressed, Hopeless 0  PHQ - 2 Score 0  Altered sleeping 0  Tired, decreased energy 0  Change in appetite 0  Feeling bad or failure about yourself  0  Trouble concentrating 0  Moving slowly or fidgety/restless 0  Suicidal thoughts 0  PHQ-9 Score 0     BP Readings from Last 3 Encounters:  12/23/23 130/80  10/16/23 117/71  04/16/23 126/72    BP 130/80   Pulse 60   Ht 5' 8 (1.727 m)   Wt 154 lb 9.6 oz (70.1 kg)    SpO2 99%   BMI 23.51 kg/m    Physical Exam          Physical Exam Constitutional:      Appearance: Normal appearance.  Neurological:     General: No focal deficit present.     Mental Status: He is alert and oriented to person, place, and time. Mental status is at baseline.     ASSESSMENT/PLAN:   Assessment & Plan Need for COVID-19 vaccine  Routine general medical examination at a health care facility  Hyperlipidemia with target LDL less than 100  Other specified hypothyroidism  Primary hypertension  Screening for prostate cancer  Screening for diabetes mellitus  Hypothyroidism, unspecified type    Assessment and Plan    Adult Wellness Visit Routine visit with no significant concerns. Discussed importance of strength training to combat sarcopenia. - Encouraged strength training twice a week, focusing on full body exercises. - Continue regular walking routine. -Comprehensive annual physical exam completed today. Reviewed interval history, current medical issues, medications, allergies, and preventive care needs. Addressed all patient questions and concerns. Discussed lifestyle factors including diet, exercise, sleep, and stress management. Reviewed recommended age-appropriate screenings, labs, and vaccinations. Counseling provided on healthy habits and routine health maintenance. Follow-up as  indicated based on findings and results. ->=15 minutes spent in face-to-face counseling focused on reducing cardiovascular risk. Topics reviewed: - Assessment of individual CVD risk factors (BP, lipids, glucose, weight, activity level) - Lifestyle modifications including heart-healthy diet, sodium reduction, and increased physical activity - Importance of blood pressure control, lipid optimization, and medication adherence - Personalized risk-reduction plan discussed and agreed upon   Immunization management Up to date on COVID and flu vaccinations. RSV vaccination not  pursued.  Screening for prostate cancer Intermittent tingling in anal area, previous urologist evaluations unremarkable. Discussed potential causes including chronic prostatitis. - Ordered PSA test. - Monitor symptoms and reassess if more frequent or severe.  Screening for diabetes mellitus A1c test performed due to family history. No current symptoms suggestive of diabetes. - Await A1c test results.     HLD - Lipid panel and CMP pending   PSA screening -PSA, CBC      Pearson Reasons A. Vita MD [facility]

## 2023-12-24 ENCOUNTER — Ambulatory Visit: Payer: Self-pay | Admitting: Family Medicine

## 2023-12-24 LAB — CBC WITH DIFFERENTIAL/PLATELET
Basophils Absolute: 0.1 x10E3/uL (ref 0.0–0.2)
Basos: 1 %
EOS (ABSOLUTE): 0.1 x10E3/uL (ref 0.0–0.4)
Eos: 2 %
Hematocrit: 47.4 % (ref 37.5–51.0)
Hemoglobin: 16.1 g/dL (ref 13.0–17.7)
Immature Grans (Abs): 0 x10E3/uL (ref 0.0–0.1)
Immature Granulocytes: 0 %
Lymphocytes Absolute: 1.1 x10E3/uL (ref 0.7–3.1)
Lymphs: 14 %
MCH: 34.7 pg — ABNORMAL HIGH (ref 26.6–33.0)
MCHC: 34 g/dL (ref 31.5–35.7)
MCV: 102 fL — ABNORMAL HIGH (ref 79–97)
Monocytes Absolute: 0.7 x10E3/uL (ref 0.1–0.9)
Monocytes: 8 %
Neutrophils Absolute: 6.2 x10E3/uL (ref 1.4–7.0)
Neutrophils: 75 %
Platelets: 145 x10E3/uL — ABNORMAL LOW (ref 150–450)
RBC: 4.64 x10E6/uL (ref 4.14–5.80)
RDW: 12 % (ref 11.6–15.4)
WBC: 8.2 x10E3/uL (ref 3.4–10.8)

## 2023-12-24 LAB — COMPREHENSIVE METABOLIC PANEL WITH GFR
ALT: 31 IU/L (ref 0–44)
AST: 25 IU/L (ref 0–40)
Albumin: 4.3 g/dL (ref 3.8–4.8)
Alkaline Phosphatase: 98 IU/L (ref 47–123)
BUN/Creatinine Ratio: 17 (ref 10–24)
BUN: 18 mg/dL (ref 8–27)
Bilirubin Total: 0.8 mg/dL (ref 0.0–1.2)
CO2: 25 mmol/L (ref 20–29)
Calcium: 9.6 mg/dL (ref 8.6–10.2)
Chloride: 102 mmol/L (ref 96–106)
Creatinine, Ser: 1.04 mg/dL (ref 0.76–1.27)
Globulin, Total: 2.6 g/dL (ref 1.5–4.5)
Glucose: 91 mg/dL (ref 70–99)
Potassium: 3.8 mmol/L (ref 3.5–5.2)
Sodium: 143 mmol/L (ref 134–144)
Total Protein: 6.9 g/dL (ref 6.0–8.5)
eGFR: 74 mL/min/1.73 (ref >59)

## 2023-12-24 LAB — LIPID PANEL
Cholesterol, Total: 141 mg/dL (ref 100–199)
HDL: 62 mg/dL (ref >39)
LDL CALC COMMENT:: 2.3 ratio (ref 0.0–5.0)
LDL Chol Calc (NIH): 61 mg/dL (ref 0–99)
Triglycerides: 95 mg/dL (ref 0–149)
VLDL Cholesterol Cal: 18 mg/dL (ref 5–40)

## 2023-12-24 LAB — PSA: Prostate Specific Ag, Serum: 1.3 ng/mL (ref 0.0–4.0)

## 2023-12-24 LAB — TSH: TSH: 0.94 u[IU]/mL (ref 0.450–4.500)

## 2023-12-24 LAB — T4, FREE: Free T4: 1.84 ng/dL — AB (ref 0.82–1.77)

## 2023-12-28 ENCOUNTER — Encounter: Payer: Self-pay | Admitting: Family Medicine

## 2024-01-16 ENCOUNTER — Other Ambulatory Visit: Payer: Self-pay | Admitting: Family Medicine

## 2024-01-16 DIAGNOSIS — I10 Essential (primary) hypertension: Secondary | ICD-10-CM

## 2024-01-16 DIAGNOSIS — E039 Hypothyroidism, unspecified: Secondary | ICD-10-CM

## 2024-02-19 ENCOUNTER — Other Ambulatory Visit: Payer: Self-pay | Admitting: Family Medicine

## 2024-02-19 DIAGNOSIS — E785 Hyperlipidemia, unspecified: Secondary | ICD-10-CM

## 2024-04-15 ENCOUNTER — Ambulatory Visit (INDEPENDENT_AMBULATORY_CARE_PROVIDER_SITE_OTHER): Admitting: Otolaryngology

## 2024-12-23 ENCOUNTER — Encounter: Admitting: Family Medicine
